# Patient Record
Sex: Female | Born: 2000 | Race: Black or African American | Hispanic: No | Marital: Single | Smoking: Never smoker
Health system: Southern US, Community
[De-identification: ages and names within clinical notes are randomized; demographics above are authoritative.]

## PROBLEM LIST (undated history)

## (undated) ENCOUNTER — Inpatient Hospital Stay (HOSPITAL_COMMUNITY): Payer: Self-pay

## (undated) DIAGNOSIS — Z789 Other specified health status: Secondary | ICD-10-CM

## (undated) DIAGNOSIS — A749 Chlamydial infection, unspecified: Secondary | ICD-10-CM

## (undated) HISTORY — PX: NO PAST SURGERIES: SHX2092

---
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
## 2018-05-21 IMAGING — US US MFM OB DETAIL+14 WK
1 series · 14 of 28 positions shown · non-contrast
Comparison: none

[Series 1: us mfm ob detail+14 wk · 79 acquisitions, 14 frames shown]
[im 3/79]
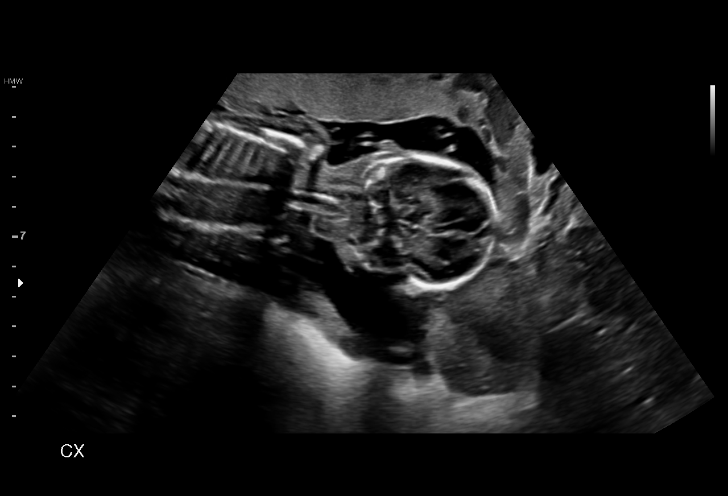
[im 9/79]
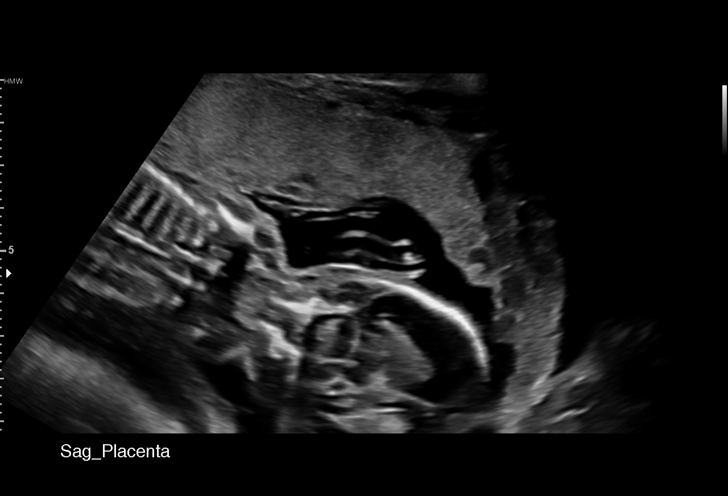
[im 15/79]
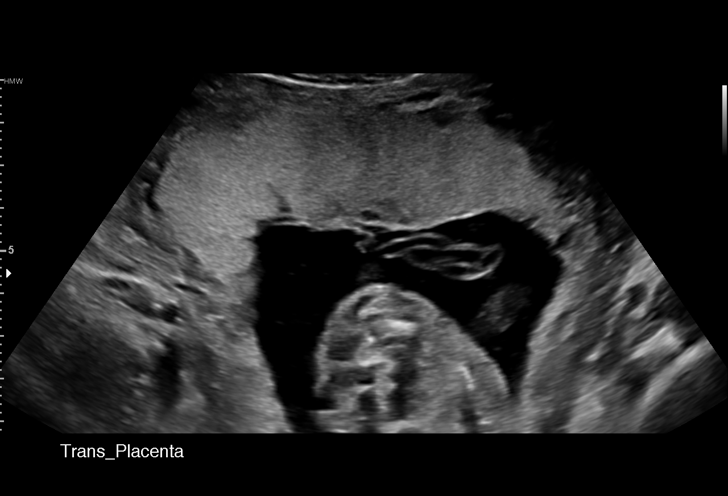
[im 21/79]
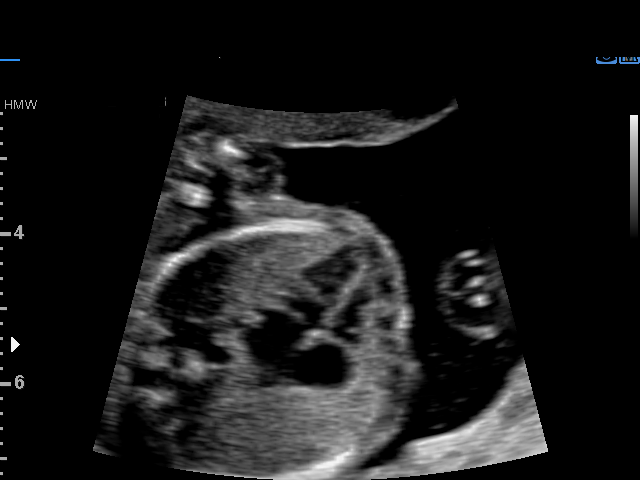
[im 27/79]
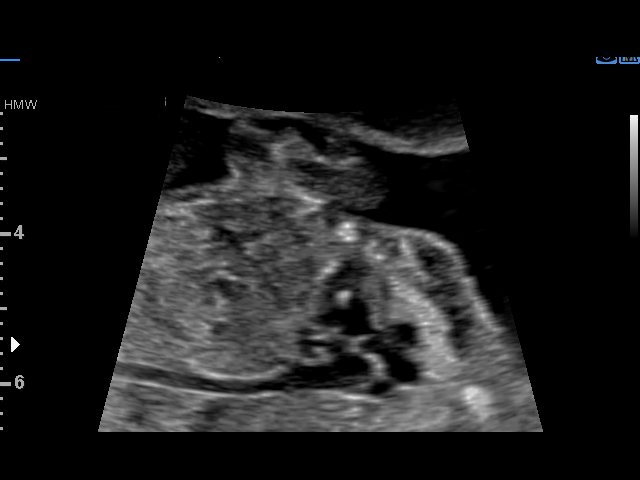
[im 32/79]
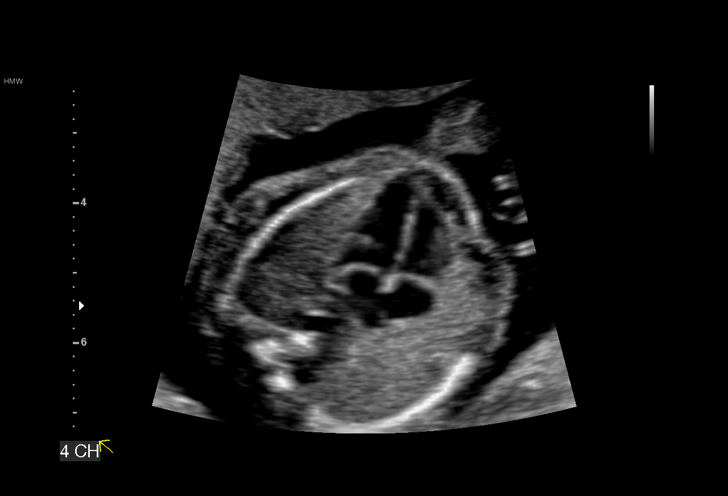
[im 38/79]
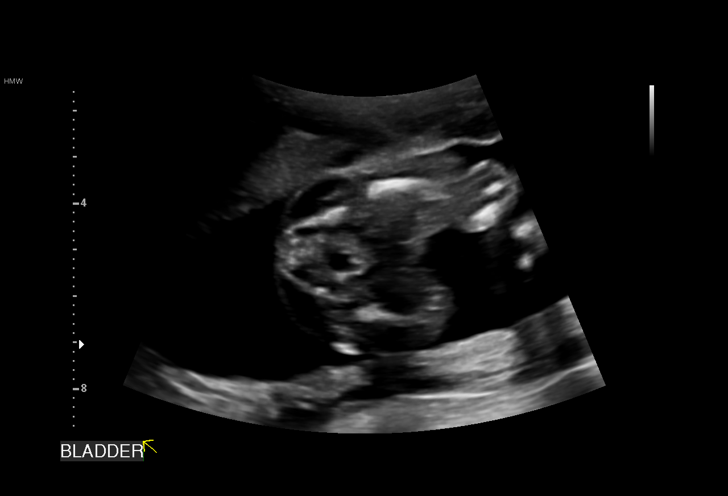
[im 44/79]
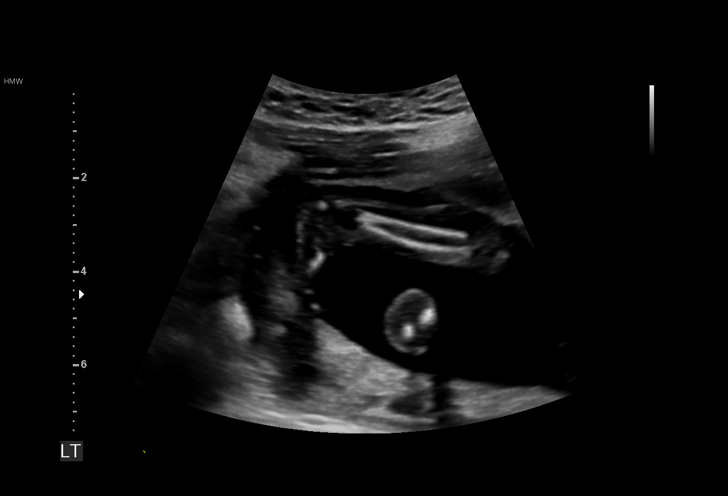
[im 50/79]
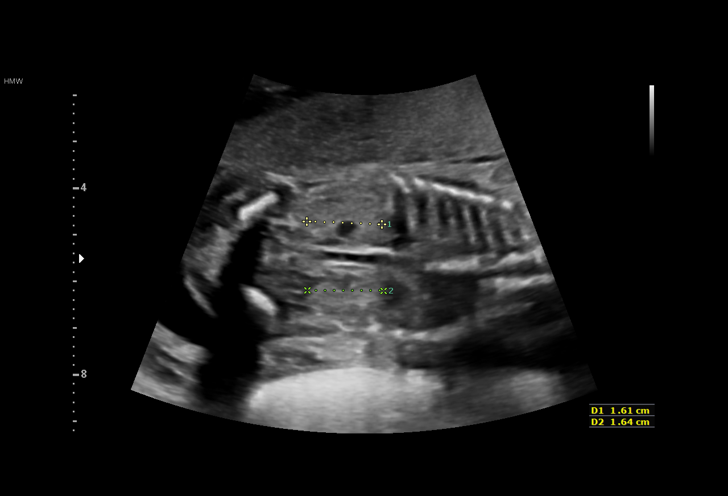
[im 55/79]
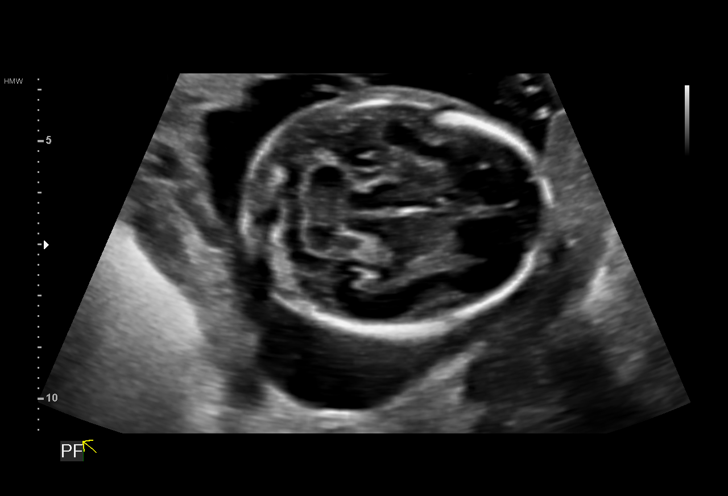
[im 61/79]
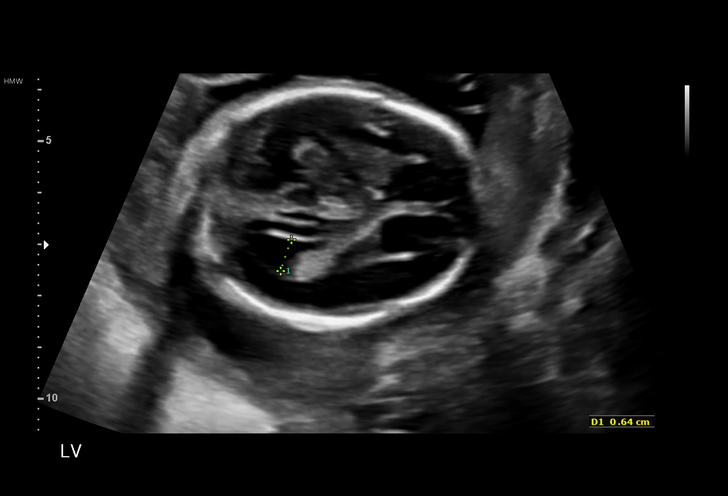
[im 67/79]
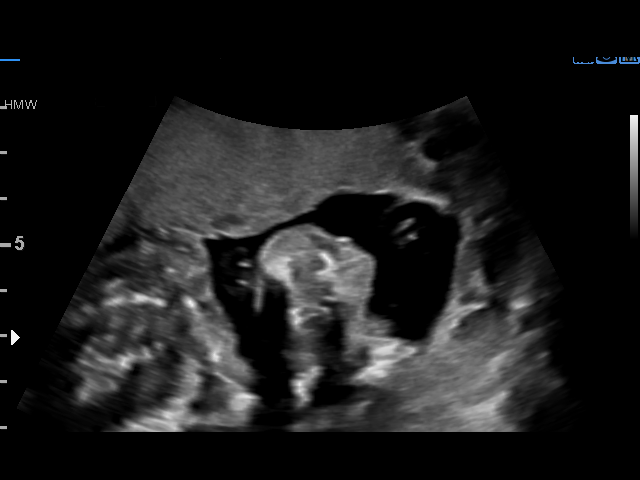
[im 73/79]
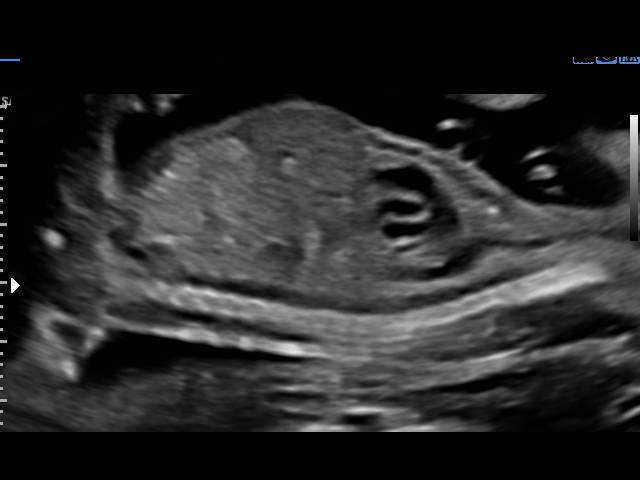
[im 79/79]
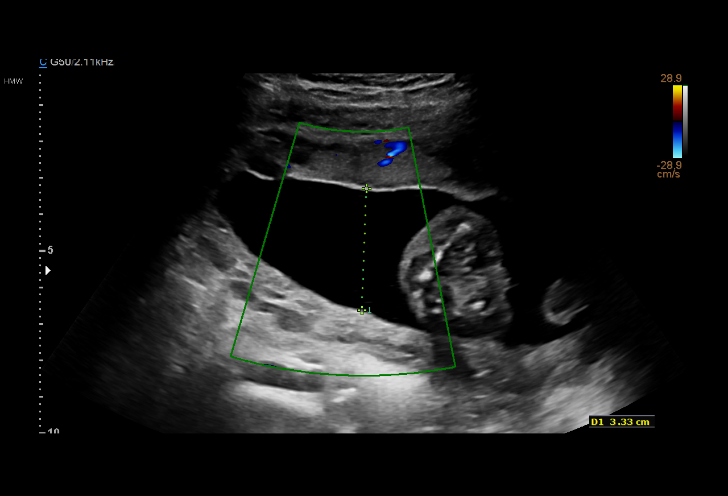

[14 of 28 positions shown; findings below may reference images not displayed]

1  HOUSSA SRA         [id number]      [id number]     [id number]
Indications

19 weeks gestation of pregnancy
Family history of congenital anomaly (FOB
situs inversus totalis)
Encounter for antenatal screening for
malformations
OB History

Gravidity:    1         Term:   0        Prem:   0        SAB:   0
TOP:          0       Ectopic:  0        Living: 1
Fetal Evaluation

Num Of Fetuses:     1
Fetal Heart         154
Rate(bpm):
Cardiac Activity:   Observed
Presentation:       Cephalic
Placenta:           Anterior, above cervical os
P. Cord Insertion:  Visualized, central

Amniotic Fluid
AFI FV:      Subjectively within normal limits
Biometry

BPD:      44.8  mm     G. Age:  19w 4d         68  %    CI:        72.31   %    70 - 86
FL/HC:      16.9   %    16.1 -
HC:      167.6  mm     G. Age:  19w 3d         56  %    HC/AC:      1.22        1.09 -
AC:      137.4  mm     G. Age:  19w 1d         47  %    FL/BPD:     63.2   %
FL:       28.3  mm     G. Age:  18w 5d         27  %    FL/AC:      20.6   %    20 - 24
HUM:      27.7  mm     G. Age:  18w 6d         43  %
Est. FW:     269  gm      0 lb 9 oz     42  %
Gestational Age

LMP:           19w 1d        Date:  02/24/17                 EDD:   12/01/17
U/S Today:     19w 2d                                        EDD:   11/30/17
Best:          19w 1d     Det. By:  LMP  (02/24/17)          EDD:   12/01/17
Anatomy

Cranium:               Appears normal         Aortic Arch:            Appears normal
Cavum:                 Appears normal         Ductal Arch:            Appears normal
Ventricles:            Appears normal         Diaphragm:              Appears normal
Choroid Plexus:        Appears normal         Stomach:                Appears normal, left
sided
Cerebellum:            Appears normal         Abdomen:                Appears normal
Posterior Fossa:       Appears normal         Abdominal Wall:         Appears nml (cord
insert, abd wall)
Nuchal Fold:           Appears normal         Cord Vessels:           Appears normal (3
vessel cord)
Face:                  Appears normal         Kidneys:                Appear normal
(orbits and profile)
Lips:                  Appears normal         Bladder:                Appears normal
Thoracic:              Appears normal         Spine:                  Not well visualized
Heart:                 Appears normal         Upper Extremities:      Appears normal
RVOT:                  Appears normal         Lower Extremities:      Appears normal
LVOT:                  Appears normal

Other:  Fetus appears to be a male. Heels visualized. Nasal bone visualized.
Technically difficult due to fetal position.
Cervix Uterus Adnexa

Cervix
Length:            3.1  cm.
Normal appearance by transabdominal scan.

Uterus
No abnormality visualized.

Left Ovary
No adnexal mass visualized.

Right Ovary
No adnexal mass visualized.

Cul De Sac:   No free fluid seen.

Adnexa:       No abnormality visualized.
Impression

SIUP at 67w6d for attempted comprehensive fetal survey in
gestation complicated by paternal history of situs inversus
active singleton fetus in cephalic presentation
situs solitus (normal)
no dysmorphic features are seen
limited spine views appear normal
EFW 42nd%'le
amniotic fluid volume is gestational age appropriate
cervix is long and closed, conferring low-risk for spontaneous
preterm delivery
no previa

Patient declined genetic counseling after situs solitus was
demonstrated.
Recommendations

Attempt to complete survey in 6 weeks was arranged prior to
departure from our ultrasound unit.

## 2019-03-17 ENCOUNTER — Other Ambulatory Visit: Payer: Self-pay

## 2019-03-17 ENCOUNTER — Encounter (HOSPITAL_COMMUNITY): Payer: Self-pay | Admitting: *Deleted

## 2019-03-17 ENCOUNTER — Emergency Department (HOSPITAL_COMMUNITY)
Admission: EM | Admit: 2019-03-17 | Discharge: 2019-03-18 | Disposition: A | Discharge status: Home / Self Care | Payer: Medicaid Other | Attending: Emergency Medicine | Admitting: Emergency Medicine

## 2019-03-17 DIAGNOSIS — N3000 Acute cystitis without hematuria: Secondary | ICD-10-CM | POA: Insufficient documentation

## 2019-03-17 DIAGNOSIS — N3090 Cystitis, unspecified without hematuria: Secondary | ICD-10-CM

## 2019-03-17 DIAGNOSIS — N61 Mastitis without abscess: Secondary | ICD-10-CM

## 2019-03-17 DIAGNOSIS — O9122 Nonpurulent mastitis associated with the puerperium: Secondary | ICD-10-CM | POA: Diagnosis present

## 2019-03-17 DIAGNOSIS — R590 Localized enlarged lymph nodes: Secondary | ICD-10-CM | POA: Diagnosis not present

## 2019-03-17 DIAGNOSIS — R509 Fever, unspecified: Secondary | ICD-10-CM | POA: Diagnosis not present

## 2019-03-17 DIAGNOSIS — R300 Dysuria: Secondary | ICD-10-CM | POA: Insufficient documentation

## 2019-03-17 LAB — URINALYSIS, ROUTINE W REFLEX MICROSCOPIC
Bilirubin Urine: NEGATIVE
Glucose, UA: NEGATIVE mg/dL
Hgb urine dipstick: NEGATIVE
Ketones, ur: 5 mg/dL — AB
Nitrite: NEGATIVE
Protein, ur: 30 mg/dL — AB
Specific Gravity, Urine: 1.034 — ABNORMAL HIGH (ref 1.005–1.030)
WBC, UA: >50 WBC/hpf — ABNORMAL HIGH (ref 0–5)
pH: 5 (ref 5.0–8.0)

## 2019-03-17 MED ORDER — CEPHALEXIN 500 MG PO CAPS: 500.0000 mg | ORAL_CAPSULE | Freq: Four times a day (QID) | ORAL | 0 refills | Status: DC

## 2019-03-17 MED ORDER — CEPHALEXIN 250 MG PO CAPS
500.0000 mg | ORAL_CAPSULE | Freq: Once | ORAL | Status: AC
  Administered 2019-03-17: 500 mg via ORAL
  Filled 2019-03-17: qty 2

## 2019-03-17 NOTE — ED Triage Notes (Signed)
Pt has had dysuria since this morning. Also more concerned with breast pain. Pt is breastfeeding; has significant pain and redness to L breast while at work. Attempted to pump and pumped more blood than milk. Reports chills that also started this afternoon. Took ibuprofen at 4pm

## 2019-03-17 NOTE — Discharge Instructions (Signed)
Please read and follow all provided instructions.  Your diagnoses today include:  1. Mastitis   2. Cystitis     Tests performed today include:  Vital signs. See below for your results today.   Medications prescribed:   Keflex (cephalexin) - antibiotic  You have been prescribed an antibiotic medicine: take the entire course of medicine even if you are feeling better. Stopping early can cause the antibiotic not to work.  Take any prescribed medications only as directed.   Home care instructions:  Follow any educational materials contained in this packet. Do not apply alcohol or hydrogen peroxide. Cover the area if it draining or weeping.   Follow-up instructions: Please follow-up with your primary care provider in the next 3-5 days if symptoms are not improving.  Return instructions:  Return to the Emergency Department if you have:  Persistent or worsening fever  Worsening symptoms  Worsening pain  Worsening swelling  Redness of the skin that moves away from the affected area, especially if it streaks away from the affected area   Any other emergent concerns  Your vital signs today were: BP 115/82 (BP Location: Right Arm)    Pulse (!) 128    Temp 98.4 F (36.9 C) (Oral)    Resp 18    SpO2 99%  If your blood pressure (BP) was elevated above 135/85 this visit, please have this repeated by your doctor within one month. --------------

## 2019-03-17 NOTE — ED Provider Notes (Signed)
[facility] EMERGENCY DEPARTMENT Provider Note   CSN: [id number] Arrival date & time: 03/17/19  2215     History   Chief Complaint Chief Complaint  Patient presents with  . Dysuria    HPI Lisa Best is a 18 y.o. female.     Patient presents with complaint of breast pain and fever as well as dysuria and increased frequency and urgency.  Dysuria started this morning.  She has never had a urinary tract infection before.  No blood noted in the urine.  No vaginal discharge.  In addition, patient also noted that this morning she had significant pain in her left breast while pumping.  No problems with the right breast.  She has a son who is 60 month old.  She pumped again while at work and noted a significant amount of blood in her breast milk along with increasing pain and redness of the left breast. She also developed chills and her temperature was 102 F.  Patient did take ibuprofen at 4 PM today.  No fever neurologic emergency department.     Past Medical History:  Diagnosis Date  . Chlamydia 06/2016   treated  . Medical history non-contributory     Patient Active Problem List   Diagnosis Date Noted  . Encounter for elective induction of labor 11/26/2017  . Nausea vomiting and diarrhea 11/16/2017    Past Surgical History:  Procedure Laterality Date  . NO PAST SURGERIES       OB History    Gravida  1   Para  0   Term  0   Preterm  0   AB  0   Living  0     SAB  0   TAB  0   Ectopic  0   Multiple  0   Live Births               Home Medications    Prior to Admission medications   Medication Sig Start Date End Date Taking? Authorizing Provider  EPINEPHrine (EPIPEN 2-PAK IJ) Inject 1 Applicatorful as directed once as needed (for severe allergic reaction).    [provider]  Prenatal Vit-Fe Fumarate-FA (PRENATAL VITAMIN PO) Take 1 tablet by mouth daily.     [provider]    Family History No family  history on file.  Social History Social History   Tobacco Use  . Smoking status: Never Smoker  . Smokeless tobacco: Never Used  Substance Use Topics  . Alcohol use: No  . Drug use: No    Types: Marijuana    Comment: occasional     Allergies   Bee venom   Review of Systems Review of Systems  Constitutional: Positive for chills and fever.  HENT: Negative for rhinorrhea and sore throat.   Eyes: Negative for redness.  Respiratory: Negative for cough.   Cardiovascular: Positive for chest pain (breast pain).  Gastrointestinal: Negative for abdominal pain, diarrhea, nausea and vomiting.  Genitourinary: Positive for dysuria, frequency and urgency. Negative for hematuria, vaginal bleeding and vaginal discharge.  Musculoskeletal: Negative for myalgias.  Skin: Negative for rash.  Neurological: Negative for headaches.     Physical Exam Updated Vital Signs BP 115/82 (BP Location: Right Arm)   Pulse (!) 128   Temp 98.4 F (36.9 C) (Oral)   Resp 18   SpO2 99%   Physical Exam Vitals signs and nursing note reviewed.  Constitutional:      Appearance: She is well-developed.  HENT:     Head: Normocephalic and atraumatic.  Eyes:     General:        Right eye: No discharge.        Left eye: No discharge.     Conjunctiva/sclera: Conjunctivae normal.  Neck:     Musculoskeletal: Normal range of motion and neck supple.  Cardiovascular:     Rate and Rhythm: Normal rate and regular rhythm.     Heart sounds: Normal heart sounds.  Pulmonary:     Effort: Pulmonary effort is normal.     Breath sounds: Normal breath sounds.  Chest:     Breasts:        Right: No swelling or bleeding.        Left: Skin change and tenderness present. No swelling, bleeding, mass or nipple discharge.    Abdominal:     Palpations: Abdomen is soft.     Tenderness: There is no abdominal tenderness.  Lymphadenopathy:     Upper Body:     Left upper body: Pectoral adenopathy present.  Skin:     General: Skin is warm and dry.  Neurological:     Mental Status: She is alert.      ED Treatments / Results  Labs (all labs ordered are listed, but only abnormal results are displayed) Labs Reviewed  URINALYSIS, ROUTINE W REFLEX MICROSCOPIC - Abnormal; Notable for the following components:      Result Value   APPearance HAZY (*)    Specific Gravity, Urine 1.034 (*)    Ketones, ur 5 (*)    Protein, ur 30 (*)    Leukocytes,Ua LARGE (*)    WBC, UA >50 (*)    Bacteria, UA FEW (*)    All other components within normal limits    EKG    Radiology No results found.  Procedures Procedures (including critical care time)  Medications Ordered in ED Medications  cephALEXin (KEFLEX) capsule 500 mg (500 mg Oral Given 03/17/19 2258)     Initial Impression / Assessment and Plan / ED Course  I have reviewed the triage vital signs and the nursing notes.  Pertinent labs & imaging results that were available during my care of the patient were reviewed by me and considered in my medical decision making (see chart for details).        Patient seen and examined.  UA pending.  Suspect recent fever due to mastitis.  Patient will start on Keflex.  She appears well, nontoxic.  Vital signs reviewed and are as follows: BP 115/82 (BP Location: Right Arm)   Pulse (!) 128   Temp 98.4 F (36.9 C) (Oral)   Resp 18   SpO2 99%   11:44 PM patient's UA and clinical presentation are most consistent with acute cystitis.  Doubt pyelo given lack of flank pain.  Patient is nontoxic in appearance.  Recheck temperature in room and it was 98.4 F.  Patient counseled on use of Keflex, warm compresses.  Encourage PCP follow-up in 3 to 5 days if not improved.  Encouraged return to the emergency department with high persistent fever, worsening redness or pain, if she develops a boil-like area on the breast.  Discussed conservative measures.  Final Clinical Impressions(s) / ED Diagnoses   Final diagnoses:   Mastitis  Cystitis   Mastitis: Secondary to breast-feeding.  Patient did report a fever today.  Antibiotics indicated.  Patient is nontoxic.  Doubt sepsis.  Pulse rate elevated on arrival with no other sirs criteria  met.  Cystitis: Symptoms consistent with lower UTI.  No flank pain to suspect pyelonephritis.  I suspect this fever is more related to mastitis versus UTI.  Patient appears well, nontoxic.  ED Discharge Orders         Ordered    cephALEXin (KEFLEX) 500 MG capsule  4 times daily     03/17/19 2344           Carlisle Cater, Hershal Coria 03/17/19 2347    Carmin Muskrat, MD 03/18/19 1806

## 2019-09-20 ENCOUNTER — Other Ambulatory Visit: Payer: Self-pay

## 2019-09-20 ENCOUNTER — Encounter (HOSPITAL_COMMUNITY): Payer: Self-pay

## 2019-09-20 ENCOUNTER — Emergency Department (HOSPITAL_COMMUNITY)
Admission: EM | Admit: 2019-09-20 | Discharge: 2019-09-20 | Disposition: A | Discharge status: Home / Self Care | Payer: Medicaid Other | Attending: Emergency Medicine | Admitting: Emergency Medicine

## 2019-09-20 DIAGNOSIS — J029 Acute pharyngitis, unspecified: Secondary | ICD-10-CM | POA: Insufficient documentation

## 2019-09-20 LAB — GROUP A STREP BY PCR: Group A Strep by PCR: NOT DETECTED

## 2019-09-20 MED ORDER — ACETAMINOPHEN 325 MG PO TABS
650.0000 mg | ORAL_TABLET | Freq: Once | ORAL | Status: AC
  Administered 2019-09-20: 18:00:00 650 mg via ORAL
  Filled 2019-09-20: qty 2

## 2019-09-20 NOTE — ED Provider Notes (Signed)
[facility] EMERGENCY DEPARTMENT Provider Note   CSN: [id number] Arrival date & time: 09/20/19  1718     History Chief Complaint  Patient presents with  . Sore Throat    Lisa Best is a 18 y.o. female.  18 year old who presents for sore throat.  Patient states she had a sore throat approximately 2 to 3 days ago.  The pain is gotten worse.  No known fevers, however she has a fever at triage.  No difficulty swallowing.  The pain is midline.  No prior history of neck abscess.  There is a strong family history of tonsillitis.  No rash.  No headache.  No abdominal pain.  No vomiting.  No ear pain.  The history is provided by the patient. No language interpreter was used.  Sore Throat This is a new problem. The problem occurs constantly. The problem has not changed since onset.Pertinent negatives include no chest pain, no abdominal pain, no headaches and no shortness of breath. The symptoms are aggravated by swallowing. Nothing relieves the symptoms. She has tried nothing for the symptoms.       Past Medical History:  Diagnosis Date  . Chlamydia 06/2016   treated  . Medical history non-contributory     Patient Active Problem List   Diagnosis Date Noted  . Encounter for elective induction of labor 11/26/2017  . Nausea vomiting and diarrhea 11/16/2017    Past Surgical History:  Procedure Laterality Date  . NO PAST SURGERIES       OB History    Gravida  1   Para  0   Term  0   Preterm  0   AB  0   Living  0     SAB  0   TAB  0   Ectopic  0   Multiple  0   Live Births              History reviewed. No pertinent family history.  Social History   Tobacco Use  . Smoking status: Never Smoker  . Smokeless tobacco: Never Used  Substance Use Topics  . Alcohol use: No  . Drug use: No    Types: Marijuana    Comment: occasional    Home Medications Prior to Admission medications   Medication Sig Start Date End Date Taking?  Authorizing Provider  cephALEXin (KEFLEX) 500 MG capsule Take 1 capsule (500 mg total) by mouth 4 (four) times daily. 03/17/19   Renne Crigler, PA-C  EPINEPHrine (EPIPEN 2-PAK IJ) Inject 1 Applicatorful as directed once as needed (for severe allergic reaction).    [provider]  Prenatal Vit-Fe Fumarate-FA (PRENATAL VITAMIN PO) Take 1 tablet by mouth daily.     [provider]    Allergies    Bee venom  Review of Systems   Review of Systems  Respiratory: Negative for shortness of breath.   Cardiovascular: Negative for chest pain.  Gastrointestinal: Negative for abdominal pain.  Neurological: Negative for headaches.  All other systems reviewed and are negative.   Physical Exam Updated Vital Signs BP 122/73 (BP Location: Left Arm)   Pulse (!) 126   Temp (!) 101.4 F (38.6 C) (Oral)   Resp 12   Ht 5\' 7"  (1.702 m)   Wt 61.8 kg   SpO2 98%   BMI 21.34 kg/m   Physical Exam Vitals and nursing note reviewed.  Constitutional:      Appearance: She is well-developed.  HENT:  Head: Normocephalic and atraumatic.     Right Ear: Tympanic membrane and external ear normal.     Left Ear: Tympanic membrane and external ear normal.     Mouth/Throat:     Pharynx: Pharyngeal swelling, oropharyngeal exudate and posterior oropharyngeal erythema present.     Comments: Minimal swelling noted, mild redness noted.  Few exudates noted on right tonsil. Eyes:     Conjunctiva/sclera: Conjunctivae normal.  Cardiovascular:     Rate and Rhythm: Normal rate.     Heart sounds: Normal heart sounds.  Pulmonary:     Effort: Pulmonary effort is normal.     Breath sounds: Normal breath sounds.  Abdominal:     General: Bowel sounds are normal.     Palpations: Abdomen is soft.     Tenderness: There is no abdominal tenderness. There is no rebound.  Musculoskeletal:        General: Normal range of motion.     Cervical back: Normal range of motion and neck supple.  Skin:    General:  Skin is warm.  Neurological:     Mental Status: She is alert and oriented to person, place, and time.     ED Results / Procedures / Treatments   Labs (all labs ordered are listed, but only abnormal results are displayed) Labs Reviewed  GROUP A STREP BY PCR    EKG None  Radiology No results found.  Procedures Procedures (including critical care time)  Medications Ordered in ED Medications  acetaminophen (TYLENOL) tablet 650 mg (650 mg Oral Given 09/20/19 1755)    ED Course  I have reviewed the triage vital signs and the nursing notes.  Pertinent labs & imaging results that were available during my care of the patient were reviewed by me and considered in my medical decision making (see chart for details).    MDM Rules/Calculators/A&P                      57 y with sore throat.  The pain is midline and no signs of pta.  Pt is non toxic and no lymphadenopathy to suggest RPA,  Possible strep so will obtain rapid test.  Too early to test for mono as symptoms for about 2-3 days, no signs of dehydration to suggest need for IVF.   No barky cough to suggest croup.     Strep is negative. Patient with likely viral pharyngitis. Discussed symptomatic care. Discussed signs that warrant reevaluation. Patient to follow up with PCP in 2-3 days if not improved.   Final Clinical Impression(s) / ED Diagnoses Final diagnoses:  Acute pharyngitis, unspecified etiology    Rx / DC Orders ED Discharge Orders    None       Louanne Skye, MD 09/20/19 2010

## 2019-09-20 NOTE — ED Triage Notes (Signed)
Pt arrives POV for eval of tonsillar pain and sore throat. Pt reports onset Sunday of throat pain, w/ swollen lymphnodes. Pt reports siblings frequently have tonsillitis and her presentation is similar. Oropharynx is patent, and clear. NARD in triage

## 2019-09-20 NOTE — ED Notes (Signed)
Pt. Left without being formally discharged. Pt. Was seen by ED provider before leaving and was made aware that her strep test came back negative.

## 2020-02-15 ENCOUNTER — Emergency Department (HOSPITAL_COMMUNITY)
Admission: EM | Admit: 2020-02-15 | Discharge: 2020-02-15 | Disposition: A | Discharge status: Home / Self Care | Payer: Medicaid Other | Attending: Emergency Medicine | Admitting: Emergency Medicine

## 2020-02-15 ENCOUNTER — Encounter (HOSPITAL_COMMUNITY): Payer: Self-pay

## 2020-02-15 ENCOUNTER — Other Ambulatory Visit: Payer: Self-pay

## 2020-02-15 DIAGNOSIS — H1031 Unspecified acute conjunctivitis, right eye: Secondary | ICD-10-CM

## 2020-02-15 DIAGNOSIS — Z79899 Other long term (current) drug therapy: Secondary | ICD-10-CM | POA: Diagnosis not present

## 2020-02-15 DIAGNOSIS — H5789 Other specified disorders of eye and adnexa: Secondary | ICD-10-CM | POA: Diagnosis present

## 2020-02-15 MED ORDER — ERYTHROMYCIN 5 MG/GM OP OINT: TOPICAL_OINTMENT | OPHTHALMIC | 0 refills | Status: DC

## 2020-02-15 NOTE — ED Provider Notes (Signed)
[facility] DEPT Provider Note   CSN: [id number] Arrival date & time: 02/15/20  0017     History Chief Complaint  Patient presents with  . Eye Drainage    Lisa Best is a 19 y.o. female.  19 y.o female with a PMH of Chlamydia presents to the ED with a chief complaint of right eye redness x today. Patient reports she's had URI symptoms for the past twp days, reports a cough with some green sputum. States, she noted crusting of her right eye today, more so of a green discharge. She has not tried any medication for improvement in symptoms. No trauma, no limited EOMS, no fevers.   The history is provided by the patient.       Past Medical History:  Diagnosis Date  . Chlamydia 06/2016   treated  . Medical history non-contributory     Patient Active Problem List   Diagnosis Date Noted  . Encounter for elective induction of labor 11/26/2017  . Nausea vomiting and diarrhea 11/16/2017    Past Surgical History:  Procedure Laterality Date  . NO PAST SURGERIES       OB History    Gravida  1   Para  0   Term  0   Preterm  0   AB  0   Living  0     SAB  0   TAB  0   Ectopic  0   Multiple  0   Live Births              No family history on file.  Social History   Tobacco Use  . Smoking status: Never Smoker  . Smokeless tobacco: Never Used  Substance Use Topics  . Alcohol use: No  . Drug use: No    Types: Marijuana    Comment: occasional    Home Medications Prior to Admission medications   Medication Sig Start Date End Date Taking? Authorizing Provider  cephALEXin (KEFLEX) 500 MG capsule Take 1 capsule (500 mg total) by mouth 4 (four) times daily. 03/17/19   Carlisle Cater, PA-C  EPINEPHrine (EPIPEN 2-PAK IJ) Inject 1 Applicatorful as directed once as needed (for severe allergic reaction).    [provider]  erythromycin ophthalmic ointment Place a 1/2 inch ribbon of ointment into the lower eyelid.  02/15/20   Janeece Fitting, PA-C  Prenatal Vit-Fe Fumarate-FA (PRENATAL VITAMIN PO) Take 1 tablet by mouth daily.     [provider]    Allergies    Bee venom  Review of Systems   Review of Systems  Constitutional: Negative for fever.  Eyes: Positive for discharge and redness. Negative for photophobia, itching and visual disturbance.    Physical Exam Updated Vital Signs BP 117/78   Pulse 89   Temp 98.1 F (36.7 C) (Oral)   Resp 18   Ht 5\' 6"  (1.676 m)   Wt 49.9 kg   SpO2 100%   BMI 17.75 kg/m   Physical Exam Vitals and nursing note reviewed.  Constitutional:      Appearance: Normal appearance.  HENT:     Head: Normocephalic and atraumatic.     Nose: Congestion present.     Mouth/Throat:     Mouth: Mucous membranes are moist.  Eyes:     General:        Right eye: Discharge present.     Extraocular Movements: Extraocular movements intact.     Right eye: Normal extraocular motion.  Left eye: Normal extraocular motion.     Conjunctiva/sclera:     Right eye: Right conjunctiva is injected. No chemosis, exudate or hemorrhage.    Left eye: Left conjunctiva is not injected. No chemosis, exudate or hemorrhage.    Comments: Pupils are equal and reactive. Yellow discharge noted on the bottom eyelid.   Cardiovascular:     Rate and Rhythm: Normal rate.  Pulmonary:     Effort: Pulmonary effort is normal.  Abdominal:     General: Abdomen is flat.  Musculoskeletal:     Cervical back: Normal range of motion and neck supple.  Skin:    General: Skin is warm and dry.  Neurological:     Mental Status: She is alert and oriented to person, place, and time.    .ED Results / Procedures / Treatments   Labs (all labs ordered are listed, but only abnormal results are displayed) Labs Reviewed - No data to display  EKG None  Radiology No results found.  Procedures Procedures (including critical care time)  Medications Ordered in ED Medications - No data to  display  ED Course  I have reviewed the triage vital signs and the nursing notes.  Pertinent labs & imaging results that were available during my care of the patient were reviewed by me and considered in my medical decision making (see chart for details).    MDM Rules/Calculators/A&P   Patient with no pertinent past medical history presents to the ED with complaints of right eye drainage and erythema since today.  Reports she has had URI symptoms the last couple days, had a green cough, symptoms were improving however noted her eye to have crusty discharge today.  Has not tried medication for improvement in her symptoms.  During evaluation his EOMs are intact, no visible stye or chalazion on my exam.  Nipples are equal and reactive, no ecchymosis or hemorrhage noted.  Small amount of yellow drainage noted to the right eye, some suspicion for bacterial conjunctivitis.  Will treat symptomatically with erythromycin ointment.  We discussed using this medication.  She is to return if she experiences any pain with EOMs, fever, worsening symptoms.  Patient nurses agrees to management, return precautions discussed at length.   Portions of this note were generated with Scientist, clinical (histocompatibility and immunogenetics). Dictation errors may occur despite Best attempts at proofreading.  Final Clinical Impression(s) / ED Diagnoses Final diagnoses:  Acute bacterial conjunctivitis of right eye    Rx / DC Orders ED Discharge Orders         Ordered    erythromycin ophthalmic ointment     02/15/20 0106           Claude Manges, PA-C 02/15/20 0109    Molpus, Jonny Ruiz, MD 02/15/20 [phone]

## 2020-02-15 NOTE — Discharge Instructions (Addendum)
I have provided a prescription to help with your right eye infection, please use this as directed daily for the next 5 days.  If you experience any fever, worsening symptoms, or difficulty with movement of your eye please return to the ED.

## 2020-02-15 NOTE — ED Triage Notes (Signed)
Pt c/o right eye redness that is draining greenish color since this morning.

## 2021-04-03 ENCOUNTER — Emergency Department (HOSPITAL_COMMUNITY)
Admission: EM | Admit: 2021-04-03 | Discharge: 2021-04-03 | Disposition: A | Discharge status: Home / Self Care | Payer: Medicaid Other | Attending: Emergency Medicine | Admitting: Emergency Medicine

## 2021-04-03 ENCOUNTER — Other Ambulatory Visit: Payer: Self-pay

## 2021-04-03 ENCOUNTER — Encounter (HOSPITAL_COMMUNITY): Payer: Self-pay

## 2021-04-03 DIAGNOSIS — R1110 Vomiting, unspecified: Secondary | ICD-10-CM | POA: Diagnosis present

## 2021-04-03 DIAGNOSIS — G43809 Other migraine, not intractable, without status migrainosus: Secondary | ICD-10-CM | POA: Insufficient documentation

## 2021-04-03 LAB — CBC WITH DIFFERENTIAL/PLATELET
Abs Immature Granulocytes: 0.01 10*3/uL (ref 0.00–0.07)
Basophils Absolute: 0 10*3/uL (ref 0.0–0.1)
Basophils Relative: 0 %
Eosinophils Absolute: 0 10*3/uL (ref 0.0–0.5)
Eosinophils Relative: 0 %
HCT: 39.6 % (ref 36.0–46.0)
Hemoglobin: 13.5 g/dL (ref 12.0–15.0)
Immature Granulocytes: 0 %
Lymphocytes Relative: 23 %
Lymphs Abs: 1 10*3/uL (ref 0.7–4.0)
MCH: 25.8 pg — ABNORMAL LOW (ref 26.0–34.0)
MCHC: 34.1 g/dL (ref 30.0–36.0)
MCV: 75.6 fL — ABNORMAL LOW (ref 80.0–100.0)
Monocytes Absolute: 0.7 10*3/uL (ref 0.1–1.0)
Monocytes Relative: 16 %
Neutro Abs: 2.8 10*3/uL (ref 1.7–7.7)
Neutrophils Relative %: 61 %
Platelets: 214 10*3/uL (ref 150–400)
RBC: 5.24 MIL/uL — ABNORMAL HIGH (ref 3.87–5.11)
RDW: 13.6 % (ref 11.5–15.5)
WBC: 4.6 10*3/uL (ref 4.0–10.5)
nRBC: 0 % (ref 0.0–0.2)

## 2021-04-03 LAB — COMPREHENSIVE METABOLIC PANEL
ALT: 12 U/L (ref 0–44)
AST: 17 U/L (ref 15–41)
Albumin: 3.8 g/dL (ref 3.5–5.0)
Alkaline Phosphatase: 62 U/L (ref 38–126)
Anion gap: 8 (ref 5–15)
BUN: 8 mg/dL (ref 6–20)
CO2: 24 mmol/L (ref 22–32)
Calcium: 8.8 mg/dL — ABNORMAL LOW (ref 8.9–10.3)
Chloride: 105 mmol/L (ref 98–111)
Creatinine, Ser: 0.84 mg/dL (ref 0.44–1.00)
GFR, Estimated: >60 mL/min (ref >60)
Glucose, Bld: 88 mg/dL (ref 70–99)
Potassium: 3.9 mmol/L (ref 3.5–5.1)
Sodium: 137 mmol/L (ref 135–145)
Total Bilirubin: 1.3 mg/dL — ABNORMAL HIGH (ref 0.3–1.2)
Total Protein: 7.5 g/dL (ref 6.5–8.1)

## 2021-04-03 LAB — URINALYSIS, ROUTINE W REFLEX MICROSCOPIC
Bilirubin Urine: NEGATIVE
Glucose, UA: NEGATIVE mg/dL
Ketones, ur: 20 mg/dL — AB
Leukocytes,Ua: NEGATIVE
Nitrite: NEGATIVE
Protein, ur: NEGATIVE mg/dL
Specific Gravity, Urine: 1.026 (ref 1.005–1.030)
pH: 6 (ref 5.0–8.0)

## 2021-04-03 LAB — LIPASE, BLOOD: Lipase: 32 U/L (ref 11–51)

## 2021-04-03 LAB — I-STAT BETA HCG BLOOD, ED (MC, WL, AP ONLY): I-stat hCG, quantitative: <5 m[IU]/mL (ref <5)

## 2021-04-03 MED ORDER — DIPHENHYDRAMINE HCL 50 MG/ML IJ SOLN
12.5000 mg | Freq: Once | INTRAMUSCULAR | Status: AC
  Administered 2021-04-03: 12.5 mg via INTRAVENOUS
  Filled 2021-04-03: qty 1

## 2021-04-03 MED ORDER — METOCLOPRAMIDE HCL 5 MG/ML IJ SOLN
10.0000 mg | Freq: Once | INTRAMUSCULAR | Status: AC
  Administered 2021-04-03: 10 mg via INTRAVENOUS
  Filled 2021-04-03: qty 2

## 2021-04-03 MED ORDER — SODIUM CHLORIDE 0.9 % IV BOLUS
1000.0000 mL | Freq: Once | INTRAVENOUS | Status: AC
  Administered 2021-04-03: 1000 mL via INTRAVENOUS

## 2021-04-03 MED ORDER — ONDANSETRON 4 MG PO TBDP
4.0000 mg | ORAL_TABLET | Freq: Once | ORAL | Status: AC
  Administered 2021-04-03: 4 mg via ORAL
  Filled 2021-04-03: qty 1

## 2021-04-03 MED ORDER — ONDANSETRON 4 MG PO TBDP: 4.0000 mg | ORAL_TABLET | Freq: Three times a day (TID) | ORAL | 0 refills | Status: DC | PRN

## 2021-04-03 NOTE — ED Provider Notes (Signed)
[facility] EMERGENCY DEPARTMENT Provider Note   CSN: [id number] Arrival date & time: 04/03/21  1313     History Chief Complaint  Patient presents with   Migraine    Lisa Best is a 20 y.o. female who presents to ED with a chief complaint of migraine.  Started having a migraine 3 days ago.  States that she does not typically get migraines "that often."  She took an Advil last night without much improvement in her symptoms.  2 days ago started having nonbloody, nonbilious emesis that has persisted.  She states that she is unable to keep any food or drink down.  She denies any vision changes, numbness in arms or legs, head injuries, anticoagulant use, fever, neck stiffness, abdominal pain, chest pain, personal or family history of aneurysms.  HPI     Past Medical History:  Diagnosis Date   Chlamydia 06/2016   treated   Medical history non-contributory     Patient Active Problem List   Diagnosis Date Noted   Encounter for elective induction of labor 11/26/2017   Nausea vomiting and diarrhea 11/16/2017    Past Surgical History:  Procedure Laterality Date   NO PAST SURGERIES       OB History     Gravida  1   Para  0   Term  0   Preterm  0   AB  0   Living  0      SAB  0   IAB  0   Ectopic  0   Multiple  0   Live Births              No family history on file.  Social History   Tobacco Use   Smoking status: Never   Smokeless tobacco: Never  Substance Use Topics   Alcohol use: No   Drug use: No    Types: Marijuana    Comment: occasional    Home Medications Prior to Admission medications   Medication Sig Start Date End Date Taking? Authorizing Provider  EPINEPHrine (EPIPEN 2-PAK IJ) Inject 1 Applicatorful as directed once as needed (for severe allergic reaction).   Yes [provider]  ondansetron (ZOFRAN ODT) 4 MG disintegrating tablet Take 1 tablet (4 mg total) by mouth every 8 (eight) hours as needed for  nausea or vomiting. 04/03/21  Yes Burlie Cajamarca, PA-C  cephALEXin (KEFLEX) 500 MG capsule Take 1 capsule (500 mg total) by mouth 4 (four) times daily. Patient not taking: Reported on 04/03/2021 03/17/19   Renne Crigler, PA-C  erythromycin ophthalmic ointment Place a 1/2 inch ribbon of ointment into the lower eyelid. Patient not taking: Reported on 04/03/2021 02/15/20   Claude Manges, PA-C    Allergies    Bee venom  Review of Systems   Review of Systems  Constitutional:  Negative for appetite change, chills and fever.  HENT:  Negative for ear pain, rhinorrhea, sneezing and sore throat.   Eyes:  Negative for photophobia and visual disturbance.  Respiratory:  Negative for cough, chest tightness, shortness of breath and wheezing.   Cardiovascular:  Negative for chest pain and palpitations.  Gastrointestinal:  Positive for vomiting. Negative for abdominal pain, blood in stool, constipation, diarrhea and nausea.  Genitourinary:  Negative for dysuria, hematuria and urgency.  Musculoskeletal:  Negative for myalgias.  Skin:  Negative for rash.  Neurological:  Positive for headaches. Negative for dizziness, weakness and light-headedness.   Physical Exam Updated Vital Signs BP 98/66  Pulse 73   Temp 98.4 F (36.9 C)   Resp 18   Ht 5\' 5"  (1.651 m)   Wt 49.9 kg   LMP 04/03/2021   SpO2 100%   BMI 18.30 kg/m   Physical Exam Vitals and nursing note reviewed.  Constitutional:      General: She is not in acute distress.    Appearance: She is well-developed.  HENT:     Head: Normocephalic and atraumatic.     Nose: Nose normal.  Eyes:     General: No scleral icterus.       Left eye: No discharge.     Conjunctiva/sclera: Conjunctivae normal.  Neck:     Comments: No meningismus Cardiovascular:     Rate and Rhythm: Normal rate and regular rhythm.     Heart sounds: Normal heart sounds. No murmur heard.   No friction rub. No gallop.  Pulmonary:     Effort: Pulmonary effort is normal. No  respiratory distress.     Breath sounds: Normal breath sounds.  Abdominal:     General: Bowel sounds are normal. There is no distension.     Palpations: Abdomen is soft.     Tenderness: There is no abdominal tenderness. There is no guarding.  Musculoskeletal:        General: Normal range of motion.     Cervical back: Normal range of motion and neck supple.  Skin:    General: Skin is warm and dry.     Findings: No rash.  Neurological:     Mental Status: She is alert and oriented to person, place, and time.     Cranial Nerves: No cranial nerve deficit.     Sensory: No sensory deficit.     Motor: No weakness or abnormal muscle tone.     Coordination: Coordination normal.     Comments: Pupils reactive. No facial asymmetry noted. Cranial nerves appear grossly intact. Sensation intact to light touch on face, BUE and BLE. Strength 5/5 in BUE and BLE. Normal finger-to-nose coordination bilaterally.    ED Results / Procedures / Treatments   Labs (all labs ordered are listed, but only abnormal results are displayed) Labs Reviewed  CBC WITH DIFFERENTIAL/PLATELET - Abnormal; Notable for the following components:      Result Value   RBC 5.24 (*)    MCV 75.6 (*)    MCH 25.8 (*)    All other components within normal limits  COMPREHENSIVE METABOLIC PANEL - Abnormal; Notable for the following components:   Calcium 8.8 (*)    Total Bilirubin 1.3 (*)    All other components within normal limits  URINALYSIS, ROUTINE W REFLEX MICROSCOPIC - Abnormal; Notable for the following components:   APPearance HAZY (*)    Hgb urine dipstick MODERATE (*)    Ketones, ur 20 (*)    Bacteria, UA RARE (*)    All other components within normal limits  LIPASE, BLOOD  I-STAT BETA HCG BLOOD, ED ([facility], [facility], [facility] ONLY)    EKG None  Radiology No results found.  Procedures Procedures   Medications Ordered in ED Medications  ondansetron (ZOFRAN-ODT) disintegrating tablet 4 mg (4 mg Oral Given 04/03/21 1445)   metoCLOPramide (REGLAN) injection 10 mg (10 mg Intravenous Given 04/03/21 1841)  diphenhydrAMINE (BENADRYL) injection 12.5 mg (12.5 mg Intravenous Given 04/03/21 1841)  sodium chloride 0.9 % bolus 1,000 mL (1,000 mLs Intravenous New Bag/Given 04/03/21 1851)    ED Course  I have reviewed the triage vital signs and the  nursing notes.  Pertinent labs & imaging results that were available during my care of the patient were reviewed by me and considered in my medical decision making (see chart for details).  Clinical Course as of 04/03/21 1943  Thu Apr 03, 2021  1757 Creatinine: 0.84 [HK]  1757 WBC: 4.6 [HK]  1757 Hemoglobin: 13.5 [HK]    Clinical Course User Index [HK] Dietrich Pates, PA-C   MDM Rules/Calculators/A&P                          20 year old female presenting to the ED for migraine.  Migraine started 3 days ago.  Reports headache with associated photophobia, phonophobia.  She began having vomiting 2 days ago.  Headache persisted despite taking NSAIDs last night.  On exam abdomen is soft.  She has no meningeal signs.  She has no neurological deficits, no numbness, weakness or facial asymmetry noted.  She is in no acute distress.  No preceding head injury or anticoagulant use that would concern me for hemorrhage.  Lab work ordered in triage including CBC, CMP, hCG unremarkable.  Will treat with migraine cocktail, IV fluids and reassess  On recheck patient reports significant improvement in her headache.  She is tolerating p.o. intake without difficulty.  I suspect that her symptoms are due to a migraine. There are no headache characteristics that are lateralizing or concerning for increased ICP, infectious or vascular cause of her symptoms.  We will have her continue antiemetics at home, follow-up with primary care provider and return to the ER for any worsening symptoms.   Patient is hemodynamically stable, in NAD, and able to ambulate in the ED. Evaluation does not show pathology that  would require ongoing emergent intervention or inpatient treatment. I explained the diagnosis to the patient. Pain has been managed and has no complaints prior to discharge. Patient is comfortable with above plan and is stable for discharge at this time. All questions were answered prior to disposition. Strict return precautions for returning to the ED were discussed. Encouraged follow up with PCP.   An After Visit Summary was printed and given to the patient.   Portions of this note were generated with Scientist, clinical (histocompatibility and immunogenetics). Dictation errors may occur despite best attempts at proofreading.  Final Clinical Impression(s) / ED Diagnoses Final diagnoses:  Other migraine without status migrainosus, not intractable    Rx / DC Orders ED Discharge Orders          Ordered    ondansetron (ZOFRAN ODT) 4 MG disintegrating tablet  Every 8 hours PRN        04/03/21 1938             Dietrich Pates, PA-C 04/03/21 1943    Milagros Loll, MD 04/04/21 1202

## 2021-04-03 NOTE — ED Provider Notes (Signed)
Emergency Medicine Provider Triage Evaluation Note  Lisa Best , a 20 y.o. female  was evaluated in triage.  Pt complains of headache x3 days. Headache located behind bilateral eyes associated with photophobia and phonophobia. She also admits to a throbbing sensation to the posterior aspect of head. No history of headaches. No fever or chills. No neck stiffness. No recent head injury. She states for the past 2 days she has had numerous episodes of NBNB emesis and is unable to keep anything down. Admits to mild dry cough. No sick contacts or known COVID exposures. She has received 1 COVID vaccine.   Review of Systems  Positive: N, V, headache Negative: fever  Physical Exam  BP (!) 102/51 (BP Location: Left Arm)   Pulse 82   Temp 98.4 F (36.9 C)   Resp 14   Ht 5\' 5"  (1.651 m)   Wt 49.9 kg   LMP 04/03/2021   SpO2 100%   BMI 18.30 kg/m  Gen:   Awake, no distress   Resp:  Normal effort  MSK:   Moves extremities without difficulty  Other:    Medical Decision Making  Medically screening exam initiated at 2:15 PM.  Appropriate orders placed.  ZEA KOSTKA was informed that the remainder of the evaluation will be completed by another provider, this initial triage assessment does not replace that evaluation, and the importance of remaining in the ED until their evaluation is complete.  Labs ordered due to emesis and nausea. Normal neurological exam.    Lisa Foots, PA-C 04/03/21 1418    [place], 04/05/21, DO 04/03/21 1504

## 2021-04-03 NOTE — ED Triage Notes (Signed)
Pt reports severe migraine for the past 3 days along with emesis for the past 2 days. Pt unable to eat or drink anything so she started to feel very lethargic. Pt a.o in triage. 8/10 pain and nausea at this time

## 2021-04-03 NOTE — Discharge Instructions (Addendum)
Take Tylenol or ibuprofen if you have headaches. Take Zofran as needed for nausea or vomiting. Follow-up with your primary care provider or the 1 listed below. Return to the ER if you start to experience worsening headache, blurry vision, numbness in arms or legs or chest pain.

## 2021-04-11 ENCOUNTER — Emergency Department (HOSPITAL_COMMUNITY)
Admission: EM | Admit: 2021-04-11 | Discharge: 2021-04-11 | Disposition: A | Discharge status: Home / Self Care | Payer: Medicaid Other | Attending: Emergency Medicine | Admitting: Emergency Medicine

## 2021-04-11 ENCOUNTER — Encounter (HOSPITAL_COMMUNITY): Payer: Self-pay

## 2021-04-11 ENCOUNTER — Other Ambulatory Visit: Payer: Self-pay

## 2021-04-11 DIAGNOSIS — R109 Unspecified abdominal pain: Secondary | ICD-10-CM | POA: Diagnosis present

## 2021-04-11 DIAGNOSIS — N390 Urinary tract infection, site not specified: Secondary | ICD-10-CM | POA: Diagnosis not present

## 2021-04-11 LAB — URINALYSIS, ROUTINE W REFLEX MICROSCOPIC
Bacteria, UA: NONE SEEN
Bilirubin Urine: NEGATIVE
Glucose, UA: NEGATIVE mg/dL
Ketones, ur: NEGATIVE mg/dL
Nitrite: NEGATIVE
Protein, ur: 30 mg/dL — AB
RBC / HPF: >50 RBC/hpf — ABNORMAL HIGH (ref 0–5)
Specific Gravity, Urine: 1.018 (ref 1.005–1.030)
WBC, UA: >50 WBC/hpf — ABNORMAL HIGH (ref 0–5)
pH: 6 (ref 5.0–8.0)

## 2021-04-11 LAB — POC URINE PREG, ED: Preg Test, Ur: NEGATIVE

## 2021-04-11 MED ORDER — PHENAZOPYRIDINE HCL 200 MG PO TABS: 200.0000 mg | ORAL_TABLET | Freq: Two times a day (BID) | ORAL | 0 refills | Status: DC | PRN

## 2021-04-11 MED ORDER — CEPHALEXIN 500 MG PO CAPS: 500.0000 mg | ORAL_CAPSULE | Freq: Two times a day (BID) | ORAL | 0 refills | Status: DC

## 2021-04-11 NOTE — ED Provider Notes (Signed)
[facility]-EMERGENCY DEPT Provider Note   CSN: [id number] Arrival date & time: 04/11/21  1644     History Chief Complaint  Patient presents with   Abdominal Pain   Hematuria   Dysuria    Lisa Best is a 20 y.o. female with past medical history significant for Chlamydia who presents for evaluation of suprapubic tenderness, urinary frequency and dark urine. Symptoms x 2 days No focal RLQ, LLQ pain., no vaginal discharge. No concern for STD. Denies chance of pregnancy. No flank pain, back pain, hx of kidney stone. Has not taken anything for symptoms. Rates pain a 3/10. No fever, emesis, CP, diarrhea. Denies additional aggrieving or alleviating factors.  History obtained from patient and past medical records. No interpretor was used.  HPI     Past Medical History:  Diagnosis Date   Chlamydia 06/2016   treated   Medical history non-contributory     Patient Active Problem List   Diagnosis Date Noted   Encounter for elective induction of labor 11/26/2017   Nausea vomiting and diarrhea 11/16/2017    Past Surgical History:  Procedure Laterality Date   NO PAST SURGERIES       OB History     Gravida  1   Para  0   Term  0   Preterm  0   AB  0   Living  0      SAB  0   IAB  0   Ectopic  0   Multiple  0   Live Births              Family History  Problem Relation Age of Onset   Healthy Mother    Healthy Father     Social History   Tobacco Use   Smoking status: Never   Smokeless tobacco: Never  Vaping Use   Vaping Use: Never used  Substance Use Topics   Alcohol use: Yes   Drug use: Not Currently    Types: Marijuana    Comment: occasional    Home Medications Prior to Admission medications   Medication Sig Start Date End Date Taking? Authorizing Provider  cephALEXin (KEFLEX) 500 MG capsule Take 1 capsule (500 mg total) by mouth 2 (two) times daily. 04/11/21  Yes Twylla Arceneaux A, PA-C  phenazopyridine (PYRIDIUM)  200 MG tablet Take 1 tablet (200 mg total) by mouth 2 (two) times daily as needed for pain. 04/11/21  Yes Khristina Janota A, PA-C  EPINEPHrine (EPIPEN 2-PAK IJ) Inject 1 Applicatorful as directed once as needed (for severe allergic reaction).    [provider]  ondansetron (ZOFRAN ODT) 4 MG disintegrating tablet Take 1 tablet (4 mg total) by mouth every 8 (eight) hours as needed for nausea or vomiting. 04/03/21   Khatri, Hina, PA-C    Allergies    Bee venom  Review of Systems   Review of Systems  Constitutional: Negative.   HENT: Negative.    Respiratory: Negative.    Cardiovascular: Negative.   Gastrointestinal: Negative.   Genitourinary:  Positive for frequency and urgency. Negative for decreased urine volume, difficulty urinating, dysuria, flank pain, genital sores, hematuria, menstrual problem, pelvic pain, vaginal bleeding, vaginal discharge and vaginal pain.  Musculoskeletal: Negative.   Skin: Negative.   Neurological: Negative.   All other systems reviewed and are negative.  Physical Exam Updated Vital Signs BP (!) 132/99 (BP Location: Left Arm)   Pulse (!) 58   Temp 98.4 F (36.9 C) (Oral)  Resp 16   Ht 5\' 5"  (1.651 m)   Wt 49.9 kg   LMP 04/03/2021   SpO2 100%   BMI 18.30 kg/m   Physical Exam Vitals and nursing note reviewed.  Constitutional:      General: She is not in acute distress.    Appearance: She is well-developed. She is not ill-appearing, toxic-appearing or diaphoretic.  HENT:     Head: Normocephalic and atraumatic.     Mouth/Throat:     Mouth: Mucous membranes are moist.  Eyes:     Pupils: Pupils are equal, round, and reactive to light.  Cardiovascular:     Rate and Rhythm: Normal rate.     Heart sounds: Normal heart sounds.  Pulmonary:     Effort: Pulmonary effort is normal. No respiratory distress.     Breath sounds: Normal breath sounds.  Abdominal:     General: Bowel sounds are normal. There is no distension.     Palpations:  Abdomen is soft.     Tenderness: There is no abdominal tenderness. There is no right CVA tenderness, left CVA tenderness, guarding or rebound. Negative signs include Murphy's sign and McBurney's sign.     Hernia: No hernia is present.  Musculoskeletal:        General: Normal range of motion.     Cervical back: Normal range of motion.  Skin:    General: Skin is warm and dry.  Neurological:     General: No focal deficit present.     Mental Status: She is alert.  Psychiatric:        Mood and Affect: Mood normal.    ED Results / Procedures / Treatments   Labs (all labs ordered are listed, but only abnormal results are displayed) Labs Reviewed  URINALYSIS, ROUTINE W REFLEX MICROSCOPIC - Abnormal; Notable for the following components:      Result Value   APPearance CLOUDY (*)    Hgb urine dipstick MODERATE (*)    Protein, ur 30 (*)    Leukocytes,Ua LARGE (*)    RBC / HPF >50 (*)    WBC, UA >50 (*)    All other components within normal limits  POC URINE PREG, ED    EKG None  Radiology No results found.  Procedures Procedures   Medications Ordered in ED Medications - No data to display  ED Course  I have reviewed the triage vital signs and the nursing notes.  Pertinent labs & imaging results that were available during my care of the patient were reviewed by me and considered in my medical decision making (see chart for details).  Here for evaluation of urinary complaints.  He is afebrile, nonseptic, not ill-appearing.  Abdomen soft, nontender.  She has no concerns for any STDs, she has no vaginal discharge or pelvic pain.  CVA tap negative bilaterally.  She denies chance of pregnancy.  Urine positive for UTI.  Will DC home with Keflex.    On repeat exam patient does not have a surgical abdomin and there are no peritoneal signs.  No indication of appendicitis, bowel obstruction, bowel perforation, cholecystitis, diverticulitis, TOA, PID, infected kidney stone or ectopic  pregnancy.    The patient has been appropriately medically screened and/or stabilized in the ED. I have low suspicion for any other emergent medical condition which would require further screening, evaluation or treatment in the ED or require inpatient management.  Patient is hemodynamically stable and in no acute distress.  Patient able to ambulate in department  prior to ED.  Evaluation does not show acute pathology that would require ongoing or additional emergent interventions while in the emergency department or further inpatient treatment.  I have discussed the diagnosis with the patient and answered all questions.  Pain is been managed while in the emergency department and patient has no further complaints prior to discharge.  Patient is comfortable with plan discussed in room and is stable for discharge at this time.  I have discussed strict return precautions for returning to the emergency department.  Patient was encouraged to follow-up with PCP/specialist refer to at discharge.      MDM Rules/Calculators/A&P                            Final Clinical Impression(s) / ED Diagnoses Final diagnoses:  Lower urinary tract infectious disease    Rx / DC Orders ED Discharge Orders          Ordered    cephALEXin (KEFLEX) 500 MG capsule  2 times daily        04/11/21 1840    phenazopyridine (PYRIDIUM) 200 MG tablet  2 times daily PRN        04/11/21 1840             Doratha Mcswain A, PA-C 04/11/21 1841    Alvira Monday, MD 04/12/21 [phone]

## 2021-04-11 NOTE — ED Triage Notes (Addendum)
Patient c/o hematuria, dysuria, and lower abdominal pain x 2 days.

## 2021-04-11 NOTE — ED Provider Notes (Signed)
Emergency Medicine Provider Triage Evaluation Note  Westley Foots , a 20 y.o. female  was evaluated in triage.  Pt complains of suprapubic discomfort.  Has noted darker urine over the last few days.  Denies vaginal discharge, concerns for any STDs.  Denies chance of pregnancy.  No dysuria however states she feels like she cannot fully empty her bladder and needs to go more often.  Some urgency.  No focal left lower quadrant right lower quadrant pain.  No radiation to back.  Review of Systems  Positive: Suprapubic pain Negative: Flank pain, nausea, vomiting, fever  Physical Exam  BP (!) 132/99 (BP Location: Left Arm)   Pulse (!) 58   Temp 98.4 F (36.9 C) (Oral)   Resp 16   LMP 04/03/2021   SpO2 100%  Gen:   Awake, no distress   Resp:  Normal effort  MSK:   Moves extremities without difficulty  ABD:  Soft, mild tenderness at suprapubic region Other:    Medical Decision Making  Medically screening exam initiated at 5:07 PM.  Appropriate orders placed.  JOURNIE HOWSON was informed that the remainder of the evaluation will be completed by another provider, this initial triage assessment does not replace that evaluation, and the importance of remaining in the ED until their evaluation is complete.  Suprapubic pain, urinary frequency   Asa Fath A, PA-C 04/11/21 1708    Alvira Monday, MD 04/12/21 [phone]

## 2021-04-11 NOTE — Discharge Instructions (Addendum)
Take the antibiotics as prescribed. Return for new or worsening symptoms. °

## 2023-03-22 LAB — OB RESULTS CONSOLE GC/CHLAMYDIA
Chlamydia: NEGATIVE
Neisseria Gonorrhea: NEGATIVE

## 2023-04-19 LAB — OB RESULTS CONSOLE ANTIBODY SCREEN: Antibody Screen: NEGATIVE

## 2023-04-19 LAB — OB RESULTS CONSOLE HIV ANTIBODY (ROUTINE TESTING): HIV: NONREACTIVE

## 2023-04-19 LAB — HEPATITIS C ANTIBODY: HCV Ab: NEGATIVE

## 2023-04-19 LAB — OB RESULTS CONSOLE RUBELLA ANTIBODY, IGM: Rubella: IMMUNE

## 2023-04-19 LAB — OB RESULTS CONSOLE HEPATITIS B SURFACE ANTIGEN: Hepatitis B Surface Ag: NEGATIVE

## 2023-04-19 LAB — OB RESULTS CONSOLE RPR: RPR: NONREACTIVE

## 2023-09-13 ENCOUNTER — Inpatient Hospital Stay (HOSPITAL_COMMUNITY)
Admission: AD | Admit: 2023-09-13 | Discharge: 2023-09-13 | Disposition: A | Discharge status: Home / Self Care | Payer: Medicaid Other | Attending: Obstetrics and Gynecology | Admitting: Obstetrics and Gynecology

## 2023-09-13 ENCOUNTER — Encounter (HOSPITAL_COMMUNITY): Payer: Self-pay

## 2023-09-13 DIAGNOSIS — R55 Syncope and collapse: Secondary | ICD-10-CM

## 2023-09-13 DIAGNOSIS — O9A213 Injury, poisoning and certain other consequences of external causes complicating pregnancy, third trimester: Secondary | ICD-10-CM | POA: Diagnosis present

## 2023-09-13 DIAGNOSIS — O99353 Diseases of the nervous system complicating pregnancy, third trimester: Secondary | ICD-10-CM | POA: Insufficient documentation

## 2023-09-13 DIAGNOSIS — Z3A31 31 weeks gestation of pregnancy: Secondary | ICD-10-CM | POA: Diagnosis not present

## 2023-09-13 LAB — URINALYSIS, ROUTINE W REFLEX MICROSCOPIC
Bilirubin Urine: NEGATIVE
Glucose, UA: NEGATIVE mg/dL
Hgb urine dipstick: NEGATIVE
Ketones, ur: NEGATIVE mg/dL
Leukocytes,Ua: NEGATIVE
Nitrite: NEGATIVE
Protein, ur: 30 mg/dL — AB
Specific Gravity, Urine: 1.027 (ref 1.005–1.030)
pH: 5 (ref 5.0–8.0)

## 2023-09-13 LAB — CBC
HCT: 30.5 % — ABNORMAL LOW (ref 36.0–46.0)
Hemoglobin: 10.4 g/dL — ABNORMAL LOW (ref 12.0–15.0)
MCH: 24.3 pg — ABNORMAL LOW (ref 26.0–34.0)
MCHC: 34.1 g/dL (ref 30.0–36.0)
MCV: 71.3 fL — ABNORMAL LOW (ref 80.0–100.0)
Platelets: 236 10*3/uL (ref 150–400)
RBC: 4.28 MIL/uL (ref 3.87–5.11)
RDW: 12.9 % (ref 11.5–15.5)
WBC: 7.7 10*3/uL (ref 4.0–10.5)
nRBC: 0 % (ref 0.0–0.2)

## 2023-09-13 LAB — BASIC METABOLIC PANEL
Anion gap: 7 (ref 5–15)
BUN: 8 mg/dL (ref 6–20)
CO2: 20 mmol/L — ABNORMAL LOW (ref 22–32)
Calcium: 8.4 mg/dL — ABNORMAL LOW (ref 8.9–10.3)
Chloride: 104 mmol/L (ref 98–111)
Creatinine, Ser: 0.64 mg/dL (ref 0.44–1.00)
GFR, Estimated: >60 mL/min (ref >60)
Glucose, Bld: 84 mg/dL (ref 70–99)
Potassium: 4 mmol/L (ref 3.5–5.1)
Sodium: 131 mmol/L — ABNORMAL LOW (ref 135–145)

## 2023-09-13 NOTE — MAU Provider Note (Signed)
History     [id number]  Arrival date and time: 09/13/23 4098    Chief Complaint  Patient presents with   Loss of Consciousness     HPI Lisa Best is a 22 y.o. at [redacted]w[redacted]d with PMHx, who presents for episode of passing out.   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 She sat down on the floor and continued to have those symptoms as well as feeling hot and flushed, also seeing black spots She denies nausea Reports her coworkers were around her and she passed out for less than a minute, she did not hit her head No loss of bowel or bladder, no seizure like activity She woke up and came to MAU Has been eating and drinking just fine No abdominal pain No vaginal bleeding or leaking fluid No contractions Fetal movement is normal      OB History     Gravida  2   Para  0   Term  0   Preterm  0   AB  0   Living  1      SAB  0   IAB  0   Ectopic  0   Multiple  0   Live Births  1           Past Medical History:  Diagnosis Date   Chlamydia 06/2016   treated   Medical history non-contributory     Past Surgical History:  Procedure Laterality Date   NO PAST SURGERIES      Family History  Problem Relation Age of Onset   Healthy Mother    Healthy Father     Social History   Socioeconomic History   Marital status: Single    Spouse name: Not on file   Number of children: Not on file   Years of education: Not on file   Highest education level: Not on file  Occupational History   Not on file  Tobacco Use   Smoking status: Never   Smokeless tobacco: Never  Vaping Use   Vaping status: Never Used  Substance and Sexual Activity   Alcohol use: Not Currently   Drug use: Not Currently    Types: Marijuana    Comment: occasional   Sexual activity: Not Currently    Birth control/protection: None  Other Topics Concern   Not on file  Social History Narrative   Not on file   Social  Drivers of Health   Financial Resource Strain: Not on file  Food Insecurity: Not on file  Transportation Needs: Not on file  Physical Activity: Not on file  Stress: Not on file  Social Connections: Unknown (02/03/2022)   Received from [facility]   Social Network    Social Network: Not on file  Intimate Partner Violence: Unknown (12/26/2021)   Received from [facility]   HITS    Physically Hurt: Not on file    Insult or Talk Down To: Not on file    Threaten Physical Harm: Not on file    Scream or Curse: Not on file    Allergies  Allergen Reactions   Bee Venom Swelling    No current facility-administered medications on file prior to encounter.   Current Outpatient Medications on File Prior to Encounter  Medication Sig Dispense Refill   cephALEXin (KEFLEX) 500 MG capsule Take 1 capsule (500 mg total) by mouth 2 (two) times daily. 20 capsule 0  EPINEPHrine (EPIPEN 2-PAK IJ) Inject 1 Applicatorful as directed once as needed (for severe allergic reaction).     ondansetron (ZOFRAN ODT) 4 MG disintegrating tablet Take 1 tablet (4 mg total) by mouth every 8 (eight) hours as needed for nausea or vomiting. 4 tablet 0   phenazopyridine (PYRIDIUM) 200 MG tablet Take 1 tablet (200 mg total) by mouth 2 (two) times daily as needed for pain. 6 tablet 0     ROS Pertinent positives and negative per HPI, all others reviewed and negative  Physical Exam   BP 116/67 (BP Location: Right Arm)   Pulse (!) 101   Temp 97.9 F (36.6 C) (Oral)   Resp 16   Ht 5\' 5"  (1.651 m)   Wt 61.1 kg   SpO2 100%   BMI 22.40 kg/m   Patient Vitals for the past 24 hrs:  BP Temp Temp src Pulse Resp SpO2 Height Weight  09/13/23 1922 116/67 97.9 F (36.6 C) Oral (!) 101 16 100 % 5\' 5"  (1.651 m) 61.1 kg    Physical Exam Vitals reviewed.  Constitutional:      General: She is not in acute distress.    Appearance: She is well-developed. She is not diaphoretic.  Eyes:     General: No scleral  icterus. Cardiovascular:     Rate and Rhythm: Normal rate and regular rhythm.     Heart sounds: Normal heart sounds.  Pulmonary:     Effort: Pulmonary effort is normal. No respiratory distress.     Breath sounds: Normal breath sounds.  Abdominal:     General: There is no distension.     Palpations: Abdomen is soft.     Tenderness: There is no abdominal tenderness. There is no guarding or rebound.  Skin:    General: Skin is warm and dry.  Neurological:     Mental Status: She is alert.     Coordination: Coordination normal.      Cervical Exam    Bedside Ultrasound Not performed.  My interpretation: n/a  FHT Baseline: 150 bpm Variability: Good {> 6 bpm) Accelerations: Reactive Decelerations: Absent Uterine activity: None Cat: I  Labs Results for orders placed or performed during the hospital encounter of 09/13/23 (from the past 24 hours)  Urinalysis, Routine w reflex microscopic -Urine, Clean Catch     Status: Abnormal   Collection Time: 09/13/23  7:32 PM  Result Value Ref Range   Color, Urine YELLOW YELLOW   APPearance CLEAR CLEAR   Specific Gravity, Urine 1.027 1.005 - 1.030   pH 5.0 5.0 - 8.0   Glucose, UA NEGATIVE NEGATIVE mg/dL   Hgb urine dipstick NEGATIVE NEGATIVE   Bilirubin Urine NEGATIVE NEGATIVE   Ketones, ur NEGATIVE NEGATIVE mg/dL   Protein, ur 30 (A) NEGATIVE mg/dL   Nitrite NEGATIVE NEGATIVE   Leukocytes,Ua NEGATIVE NEGATIVE   RBC / HPF 0-5 0 - 5 RBC/hpf   WBC, UA 0-5 0 - 5 WBC/hpf   Bacteria, UA FEW (A) NONE SEEN   Squamous Epithelial / HPF 0-5 0 - 5 /HPF   Mucus PRESENT   CBC     Status: Abnormal   Collection Time: 09/13/23  7:35 PM  Result Value Ref Range   WBC 7.7 4.0 - 10.5 K/uL   RBC 4.28 3.87 - 5.11 MIL/uL   Hemoglobin 10.4 (L) 12.0 - 15.0 g/dL   HCT 24.4 (L) 01.0 - 27.2 %   MCV 71.3 (L) 80.0 - 100.0 fL   MCH 24.3 (L) 26.0 -  34.0 pg   MCHC 34.1 30.0 - 36.0 g/dL   RDW 28.4 13.2 - 44.0 %   Platelets 236 150 - 400 K/uL   nRBC 0.0 0.0  - 0.2 %  Basic metabolic panel     Status: Abnormal   Collection Time: 09/13/23  7:35 PM  Result Value Ref Range   Sodium 131 (L) 135 - 145 mmol/L   Potassium 4.0 3.5 - 5.1 mmol/L   Chloride 104 98 - 111 mmol/L   CO2 20 (L) 22 - 32 mmol/L   Glucose, Bld 84 70 - 99 mg/dL   BUN 8 6 - 20 mg/dL   Creatinine, Ser 1.02 0.44 - 1.00 mg/dL   Calcium 8.4 (L) 8.9 - 10.3 mg/dL   GFR, Estimated >72 >53 mL/min   Anion gap 7 5 - 15    Imaging No results found.  MAU Course  Procedures Lab Orders         Urinalysis, Routine w reflex microscopic -Urine, Clean Catch         CBC         Basic metabolic panel    No orders of the defined types were placed in this encounter.  Imaging Orders  No imaging studies ordered today    MDM Moderate (Level 3-4)  Assessment and Plan  # Vasovagal syncope #[redacted] weeks gestation of pregnancy History and exam c/w vasovagal etiology. Prodrome argues against cardiac etiology. No seizure like activity. ECG reviewed and personally interpreted and is unremarkable with no arrythmias and no ischemic changes. Orthostatics are unremarkable. CBC with only mild anemia, unlikely to be contributing. BMP also unremarkable. Discussed conservative measures to avoid in the future.   #FWB FHT Cat I NST: Reactive   Dispo: discharged to home in stable condition    Venora Maples, MD/MPH 09/13/23 8:19 PM  Allergies as of 09/13/2023       Reactions   Bee Venom Swelling        Medication List     TAKE these medications    cephALEXin 500 MG capsule Commonly known as: KEFLEX Take 1 capsule (500 mg total) by mouth 2 (two) times daily.   EPIPEN 2-PAK IJ Inject 1 Applicatorful as directed once as needed (for severe allergic reaction).   ondansetron 4 MG disintegrating tablet Commonly known as: Zofran ODT Take 1 tablet (4 mg total) by mouth every 8 (eight) hours as needed for nausea or vomiting.   phenazopyridine 200 MG tablet Commonly known as:  PYRIDIUM Take 1 tablet (200 mg total) by mouth 2 (two) times daily as needed for pain.

## 2023-09-13 NOTE — MAU Note (Signed)
..  Lisa Best is a 22 y.o. 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 Patient remembers waking up and having a crowd around her.  Still feels groggy and was seeing spots on the way here.  Denies pain, vaginal bleeding, leaking of fluid. Last movement was 2 hours ago Pain score: 0/10 Vitals:   09/13/23 1922  BP: 116/67  Pulse: (!) 101  Resp: 16  Temp: 97.9 F (36.6 C)  SpO2: 100%     FHT:155 Lab orders placed from triage: UA

## 2023-09-22 NOTE — L&D Delivery Note (Signed)
 Delivery Note Patient progressed spontaneously to complete. After approximately 20 minutes, at 4:04 AM a viable female was delivered via Vaginal, Spontaneous (Presentation:   Occiput Anterior). An episiotomy was cut at the site of her prior laceration after patient consent to facilitate delivery of fetal head. Nuchal x 1 was reduced. Baby was placed on mother's abdomen. APGAR: 8, 9; weight pending. Her episiotomy was repaired in routine fashion with 2-0 vicryl.   Her placenta remained in situ after 30 minutes of active management of the third stage. It was manually removed and trailing membranes removed with a Banjo currette. The uterus was boggy after removal of the placenta which improved with tranexemic acid. A uterine sweep revealed no further membranes and good uterine tone.    Placenta status: manual removal, bilobed. Sent to pathology   Cord: 3 vessels with the following complications: None.  Cord pH: N/A  Anesthesia: Epidural Episiotomy: Left Mediolateral Lacerations: 2nd degree Suture Repair: 2.0 vicryl Est. Blood Loss (mL):  278  Mom to postpartum.  Baby to Couplet care / Skin to Skin.  Lisa Best 10/27/2023, 4:54 AM

## 2023-09-29 ENCOUNTER — Encounter (HOSPITAL_COMMUNITY): Payer: Self-pay | Admitting: Obstetrics and Gynecology

## 2023-09-29 ENCOUNTER — Inpatient Hospital Stay (HOSPITAL_COMMUNITY)
Admission: AD | Admit: 2023-09-29 | Discharge: 2023-09-29 | Disposition: A | Discharge status: Home / Self Care | Payer: Medicaid Other | Attending: Obstetrics and Gynecology | Admitting: Obstetrics and Gynecology

## 2023-09-29 ENCOUNTER — Other Ambulatory Visit: Payer: Self-pay

## 2023-09-29 DIAGNOSIS — Z3A33 33 weeks gestation of pregnancy: Secondary | ICD-10-CM | POA: Diagnosis not present

## 2023-09-29 DIAGNOSIS — O23593 Infection of other part of genital tract in pregnancy, third trimester: Secondary | ICD-10-CM | POA: Diagnosis not present

## 2023-09-29 DIAGNOSIS — O26853 Spotting complicating pregnancy, third trimester: Secondary | ICD-10-CM | POA: Insufficient documentation

## 2023-09-29 DIAGNOSIS — O26893 Other specified pregnancy related conditions, third trimester: Secondary | ICD-10-CM | POA: Insufficient documentation

## 2023-09-29 DIAGNOSIS — B9689 Other specified bacterial agents as the cause of diseases classified elsewhere: Secondary | ICD-10-CM | POA: Insufficient documentation

## 2023-09-29 LAB — WET PREP, GENITAL
Sperm: NONE SEEN
Trich, Wet Prep: NONE SEEN
WBC, Wet Prep HPF POC: >=10 — AB (ref <10)
Yeast Wet Prep HPF POC: NONE SEEN

## 2023-09-29 LAB — URINALYSIS, ROUTINE W REFLEX MICROSCOPIC
Bilirubin Urine: NEGATIVE
Glucose, UA: NEGATIVE mg/dL
Ketones, ur: NEGATIVE mg/dL
Nitrite: NEGATIVE
Protein, ur: 30 mg/dL — AB
Specific Gravity, Urine: 1.021 (ref 1.005–1.030)
WBC, UA: >50 WBC/hpf (ref 0–5)
pH: 6 (ref 5.0–8.0)

## 2023-09-29 MED ORDER — METRONIDAZOLE 500 MG PO TABS: 500.0000 mg | ORAL_TABLET | Freq: Two times a day (BID) | ORAL | 0 refills | Status: DC

## 2023-09-29 NOTE — MAU Provider Note (Signed)
 Chief Complaint:  Spotting and Cramping   HPI   Event Date/Time   First Provider Initiated Contact with Patient 09/29/23 0958      Lisa Best is a 23 y.o. G2P0001 at [redacted]w[redacted]d who presents to maternity admissions reporting c/o losing her mucus plug and spotting when she wiped after using the BR. Denies recent intercourse or vaginal exams. Denies any active VB, LOF, cramping and reports good FM's   Pregnancy Course: [facility]  Past Medical History:  Diagnosis Date   Chlamydia 06/2016   treated   Medical history non-contributory    OB History  Gravida Para Term Preterm AB Living  2 1 0 0 0 1  SAB IAB Ectopic Multiple Live Births  0 0 0 0 1    # Outcome Date GA Lbr Len/2nd Weight Sex Type Anes PTL Lv  2 Current           1 Para 2019    M Vag-Spont   LIV   Past Surgical History:  Procedure Laterality Date   NO PAST SURGERIES     Family History  Problem Relation Age of Onset   Healthy Mother    Healthy Father    Social History   Tobacco Use   Smoking status: Never   Smokeless tobacco: Never  Vaping Use   Vaping status: Never Used  Substance Use Topics   Alcohol use: Not Currently   Drug use: Not Currently    Types: Marijuana    Comment: occasional   Allergies  Allergen Reactions   Bee Venom Swelling   Medications Prior to Admission  Medication Sig Dispense Refill Last Dose/Taking   ondansetron  (ZOFRAN  ODT) 4 MG disintegrating tablet Take 1 tablet (4 mg total) by mouth every 8 (eight) hours as needed for nausea or vomiting. 4 tablet 0 Past Month   Prenatal Vit-Fe Fumarate-FA (PRENATAL MULTIVITAMIN) TABS tablet Take 1 tablet by mouth daily at 12 noon.   09/28/2023   cephALEXin  (KEFLEX ) 500 MG capsule Take 1 capsule (500 mg total) by mouth 2 (two) times daily. 20 capsule 0 Unknown   EPINEPHrine  (EPIPEN  2-PAK IJ) Inject 1 Applicatorful as directed once as needed (for severe allergic reaction).   Unknown   phenazopyridine  (PYRIDIUM ) 200 MG tablet Take 1  tablet (200 mg total) by mouth 2 (two) times daily as needed for pain. 6 tablet 0 Unknown    I have reviewed patient's Past Medical Hx, Surgical Hx, Family Hx, Social Hx, medications and allergies.   ROS  Pertinent items noted in HPI and remainder of comprehensive ROS otherwise negative.   PHYSICAL EXAM  Patient Vitals for the past 24 hrs:  BP Temp Temp src Pulse Resp SpO2 Height Weight  09/29/23 1106 111/71 -- -- 93 16 -- -- --  09/29/23 0950 -- -- -- -- -- 98 % -- --  09/29/23 0945 100/75 -- -- 98 16 -- -- --  09/29/23 0930 105/77 98 F (36.7 C) Oral (!) 101 18 99 % -- --  09/29/23 0925 -- -- -- -- -- -- 5' 5 (1.651 m) 61.8 kg    Constitutional: Well-developed, well-nourished female in no acute distress.  Cardiovascular: normal rate & rhythm, warm and well-perfused Respiratory: normal effort, no problems with respiration noted GI: Abd soft, non-tender, non-distended MS: Extremities nontender, no edema, normal ROM Neurologic: Alert and oriented x 4.  GU: no CVA tenderness Pelvic : SVE Long/Closed  and no blood visualized   Dilation: Closed Effacement (%): Thick Cervical Position:  Posterior Exam by:: L. Kailia Starry, NP  Fetal Tracing: CAT 1  Baseline: 150 Variability: Moderate Accelerations: present Decelerations:None Toco: 1 mild contraction noted    Labs: Results for orders placed or performed during the hospital encounter of 09/29/23 (from the past 24 hours)  Urinalysis, Routine w reflex microscopic -Urine, Clean Catch     Status: Abnormal   Collection Time: 09/29/23  9:42 AM  Result Value Ref Range   Color, Urine YELLOW YELLOW   APPearance CLOUDY (A) CLEAR   Specific Gravity, Urine 1.021 1.005 - 1.030   pH 6.0 5.0 - 8.0   Glucose, UA NEGATIVE NEGATIVE mg/dL   Hgb urine dipstick MODERATE (A) NEGATIVE   Bilirubin Urine NEGATIVE NEGATIVE   Ketones, ur NEGATIVE NEGATIVE mg/dL   Protein, ur 30 (A) NEGATIVE mg/dL   Nitrite NEGATIVE NEGATIVE   Leukocytes,Ua LARGE (A)  NEGATIVE   RBC / HPF 6-10 0 - 5 RBC/hpf   WBC, UA >50 0 - 5 WBC/hpf   Bacteria, UA MANY (A) NONE SEEN   Squamous Epithelial / HPF 21-50 0 - 5 /HPF   Mucus PRESENT    Amorphous Crystal PRESENT   Wet prep, genital     Status: Abnormal   Collection Time: 09/29/23 10:11 AM  Result Value Ref Range   Yeast Wet Prep HPF POC NONE SEEN NONE SEEN   Trich, Wet Prep NONE SEEN NONE SEEN   Clue Cells Wet Prep HPF POC PRESENT (A) NONE SEEN   WBC, Wet Prep HPF POC >=10 (A) <10   Sperm NONE SEEN       MDM & MAU COURSE  MDM:  MODERATE    MAU Course: Orders Placed This Encounter  Procedures   Wet prep, genital   Culture, OB Urine   Urinalysis, Routine w reflex microscopic -Urine, Clean Catch   Discharge patient      GC/Chlamydia cx sent  ASSESSMENT   Problem List Items Addressed This Visit   None Visit Diagnoses       Bacterial vaginosis    -  Primary   Relevant Medications   metroNIDAZOLE  (FLAGYL ) 500 MG tablet   Other Relevant Orders   Discharge patient     Spotting affecting pregnancy in third trimester         [redacted] weeks gestation of pregnancy       Relevant Orders   Discharge patient       Has OB F/U scheduled 1/9 at [facility]  Discharge home in stable condition with return precautions.      Allergies as of 09/29/2023       Reactions   Bee Venom Swelling        Medication List     TAKE these medications    cephALEXin  500 MG capsule Commonly known as: KEFLEX  Take 1 capsule (500 mg total) by mouth 2 (two) times daily.   EPIPEN  2-PAK IJ Inject 1 Applicatorful as directed once as needed (for severe allergic reaction).   metroNIDAZOLE  500 MG tablet Commonly known as: FLAGYL  Take 1 tablet (500 mg total) by mouth 2 (two) times daily.   ondansetron  4 MG disintegrating tablet Commonly known as: Zofran  ODT Take 1 tablet (4 mg total) by mouth every 8 (eight) hours as needed for nausea or vomiting.   phenazopyridine  200 MG tablet Commonly known as:  PYRIDIUM  Take 1 tablet (200 mg total) by mouth 2 (two) times daily as needed for pain.   prenatal multivitamin Tabs tablet Take 1 tablet  by mouth daily at 12 noon.       Olam Dalton, MSN, [facility], [facility] [employer]

## 2023-09-29 NOTE — MAU Note (Signed)
 Lisa Best is a 23 y.o. at [redacted]w[redacted]d here in MAU reporting: she began spotting this morning with wiping.  Denies recent intercourse.  Reports also having tolerable menstrual type cramping.  Denies LOF.  Endorses +FM.  LMP: NA Onset of complaint: today Pain score: 5 Vitals:   09/29/23 0930  BP: 105/77  Pulse: (!) 101  Resp: 18  Temp: 98 F (36.7 C)  SpO2: 99%     QYU:izqzmmzi d/t maternal apparel Lab orders placed from triage: UA

## 2023-09-30 LAB — GC/CHLAMYDIA PROBE AMP (~~LOC~~) NOT AT ARMC
Chlamydia: NEGATIVE
Comment: NEGATIVE
Comment: NORMAL
Neisseria Gonorrhea: NEGATIVE

## 2023-10-13 LAB — OB RESULTS CONSOLE GBS: GBS: NEGATIVE

## 2023-10-26 ENCOUNTER — Encounter (HOSPITAL_COMMUNITY): Payer: Self-pay | Admitting: Student

## 2023-10-26 ENCOUNTER — Encounter (HOSPITAL_COMMUNITY): Payer: Self-pay | Admitting: *Deleted

## 2023-10-26 ENCOUNTER — Telehealth (HOSPITAL_COMMUNITY): Payer: Self-pay | Admitting: *Deleted

## 2023-10-26 ENCOUNTER — Other Ambulatory Visit: Payer: Self-pay

## 2023-10-26 ENCOUNTER — Inpatient Hospital Stay (HOSPITAL_COMMUNITY)
Admission: AD | Admit: 2023-10-26 | Discharge: 2023-10-29 | DRG: 797 | Disposition: A | Discharge status: Home / Self Care | Payer: Medicaid Other | Attending: Obstetrics and Gynecology | Admitting: Obstetrics and Gynecology

## 2023-10-26 DIAGNOSIS — O9081 Anemia of the puerperium: Secondary | ICD-10-CM | POA: Diagnosis not present

## 2023-10-26 DIAGNOSIS — Z3A37 37 weeks gestation of pregnancy: Secondary | ICD-10-CM | POA: Diagnosis not present

## 2023-10-26 DIAGNOSIS — O4292 Full-term premature rupture of membranes, unspecified as to length of time between rupture and onset of labor: Secondary | ICD-10-CM | POA: Diagnosis present

## 2023-10-26 DIAGNOSIS — D62 Acute posthemorrhagic anemia: Secondary | ICD-10-CM | POA: Diagnosis not present

## 2023-10-26 DIAGNOSIS — O365930 Maternal care for other known or suspected poor fetal growth, third trimester, not applicable or unspecified: Secondary | ICD-10-CM | POA: Diagnosis present

## 2023-10-26 DIAGNOSIS — O4290 Premature rupture of membranes, unspecified as to length of time between rupture and onset of labor, unspecified weeks of gestation: Principal | ICD-10-CM | POA: Diagnosis present

## 2023-10-26 DIAGNOSIS — Z148 Genetic carrier of other disease: Secondary | ICD-10-CM | POA: Diagnosis not present

## 2023-10-26 LAB — CBC
HCT: 28 % — ABNORMAL LOW (ref 36.0–46.0)
Hemoglobin: 9.4 g/dL — ABNORMAL LOW (ref 12.0–15.0)
MCH: 22.9 pg — ABNORMAL LOW (ref 26.0–34.0)
MCHC: 33.6 g/dL (ref 30.0–36.0)
MCV: 68.1 fL — ABNORMAL LOW (ref 80.0–100.0)
Platelets: 246 10*3/uL (ref 150–400)
RBC: 4.11 MIL/uL (ref 3.87–5.11)
RDW: 15.5 % (ref 11.5–15.5)
WBC: 9.3 10*3/uL (ref 4.0–10.5)
nRBC: 0 % (ref 0.0–0.2)

## 2023-10-26 LAB — TYPE AND SCREEN
ABO/RH(D): A POS
Antibody Screen: NEGATIVE

## 2023-10-26 LAB — POCT FERN TEST: POCT Fern Test: POSITIVE

## 2023-10-26 MED ORDER — LIDOCAINE HCL (PF) 1 % IJ SOLN: 30.0000 mL | INTRAMUSCULAR | Status: DC | PRN

## 2023-10-26 MED ORDER — OXYTOCIN BOLUS FROM INFUSION
333.0000 mL | Freq: Once | INTRAVENOUS | Status: AC
  Administered 2023-10-27: 333 mL via INTRAVENOUS

## 2023-10-26 MED ORDER — OXYCODONE-ACETAMINOPHEN 5-325 MG PO TABS: 1.0000 | ORAL_TABLET | ORAL | Status: DC | PRN

## 2023-10-26 MED ORDER — SODIUM CHLORIDE 0.9 % IV SOLN: 250.0000 mL | INTRAVENOUS | Status: DC | PRN

## 2023-10-26 MED ORDER — OXYTOCIN-SODIUM CHLORIDE 30-0.9 UT/500ML-% IV SOLN
2.5000 [IU]/h | INTRAVENOUS | Status: DC
Start: 2023-10-27 — End: 2023-10-27
  Administered 2023-10-27: 2.5 [IU]/h via INTRAVENOUS
  Filled 2023-10-26: qty 500

## 2023-10-26 MED ORDER — LACTATED RINGERS IV SOLN: 500.0000 mL | INTRAVENOUS | Status: DC | PRN

## 2023-10-26 MED ORDER — SOD CITRATE-CITRIC ACID 500-334 MG/5ML PO SOLN: 30.0000 mL | ORAL | Status: DC | PRN

## 2023-10-26 MED ORDER — TERBUTALINE SULFATE 1 MG/ML IJ SOLN: 0.2500 mg | Freq: Once | INTRAMUSCULAR | Status: DC | PRN

## 2023-10-26 MED ORDER — SODIUM CHLORIDE 0.9% FLUSH: 3.0000 mL | INTRAVENOUS | Status: DC | PRN

## 2023-10-26 MED ORDER — SODIUM CHLORIDE 0.9% FLUSH: 3.0000 mL | Freq: Two times a day (BID) | INTRAVENOUS | Status: DC

## 2023-10-26 MED ORDER — OXYCODONE-ACETAMINOPHEN 5-325 MG PO TABS: 2.0000 | ORAL_TABLET | ORAL | Status: DC | PRN

## 2023-10-26 MED ORDER — ACETAMINOPHEN 325 MG PO TABS: 650.0000 mg | ORAL_TABLET | ORAL | Status: DC | PRN

## 2023-10-26 MED ORDER — MISOPROSTOL 50MCG HALF TABLET: 50.0000 ug | ORAL_TABLET | Freq: Four times a day (QID) | ORAL | Status: DC

## 2023-10-26 MED ORDER — ONDANSETRON HCL 4 MG/2ML IJ SOLN: 4.0000 mg | Freq: Four times a day (QID) | INTRAMUSCULAR | Status: DC | PRN

## 2023-10-26 NOTE — H&P (Signed)
 Lisa Best is a 23 y.o. female presenting for rupture of membranes. Reports leakage of clear fluid beginning at 1900 with continued leakage since. Fern positive in MAU.   +Fms. Denies VB. Contractions were not present after ROM but are now becoming more frequent and intense  Pregnancy is complicated by:  - FGR: last growth 12/18. EFW 27%, AC <2.3%. 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 Social History:  reports that she has never smoked. She has never used smokeless tobacco. She reports that she does not currently use alcohol. She reports that she does not currently use drugs after having used the following drugs: Marijuana.     Maternal Diabetes: No Genetic Screening: Normal Maternal Ultrasounds/Referrals: FGR Fetal Ultrasounds or other Referrals:  None Maternal Substance Abuse:  No Significant Maternal Medications:  None Significant Maternal Lab Results:  Group B Strep negative Number of Prenatal Visits:greater than 3 verified prenatal visits Maternal Vaccinations:RSV: Given during pregnancy <14 days ago, TDap, and Flu Other Comments:  None  Review of Systems  Constitutional:  Negative for chills and fever.  Respiratory:  Negative for chest tightness and shortness of breath.   Cardiovascular:  Negative for chest pain.  Gastrointestinal:  Positive for abdominal pain.  Genitourinary:  Positive for pelvic pain and vaginal discharge. Negative for vaginal bleeding.  Neurological:  Negative for light-headedness and headaches.     Blood pressure  125/78, pulse (!) 115, temperature 98.4 F (36.9 C), temperature source Oral, resp. rate 18, height 5' 5 (1.651 m), weight 64.4 kg, SpO2 98%, unknown if currently breastfeeding. Exam Physical Exam Constitutional:      General: She is not in acute distress.    Appearance: Normal appearance.  HENT:     Head: Normocephalic and atraumatic.  Pulmonary:     Effort: Pulmonary effort is normal.  Genitourinary:    Comments: SVE: 1/80/-3, clear fluid Musculoskeletal:        General: Normal range of motion.  Skin:    General: Skin is warm and dry.  Neurological:     Mental Status: She is alert.  Psychiatric:        Mood and Affect: Mood normal.        Behavior: Behavior normal.     Prenatal labs: ABO, Rh:  A positive Antibody: Negative (07/29 0000) Rubella: Immune (07/29 0000) RPR: Nonreactive (07/29 0000)  HBsAg: Negative (07/29 0000)  HIV: Non-reactive (07/29 0000)  GBS: Negative/-- (01/22 0000)   Assessment/Plan: 23 yo G2P1001 @ 37.3 presents with PROM - Contracting regularly after some time in MAU. Oral cytotec  vs pitocin  if CTX space out - Nitrous oxide for pain control, planning later epidural - GBS: negative   [name] Sharps 10/26/2023, 8:39 PM

## 2023-10-26 NOTE — MAU Note (Signed)
.  Lisa Best is a 23 y.o. at [redacted]w[redacted]d here in MAU reporting: SROM at 61 - clear fluid and still leaking some. Denies VB. Reports mild cramps. +FM.   Onset of complaint: 1900 Pain score: 4 Vitals:   10/26/23 2020  BP: 125/78  Pulse: (!) 115  Resp: 18  Temp: 98.4 F (36.9 C)  SpO2: 98%     FHT: 140  Lab orders placed from triage: labor eval

## 2023-10-26 NOTE — Telephone Encounter (Signed)
 Preadmission screen

## 2023-10-26 NOTE — MAU Note (Signed)

## 2023-10-27 ENCOUNTER — Encounter (HOSPITAL_COMMUNITY): Payer: Self-pay | Admitting: Student

## 2023-10-27 ENCOUNTER — Inpatient Hospital Stay (HOSPITAL_COMMUNITY): Payer: Medicaid Other | Admitting: Anesthesiology

## 2023-10-27 LAB — CBC
HCT: 23.1 % — ABNORMAL LOW (ref 36.0–46.0)
Hemoglobin: 7.8 g/dL — ABNORMAL LOW (ref 12.0–15.0)
MCH: 22.5 pg — ABNORMAL LOW (ref 26.0–34.0)
MCHC: 33.8 g/dL (ref 30.0–36.0)
MCV: 66.8 fL — ABNORMAL LOW (ref 80.0–100.0)
Platelets: 212 10*3/uL (ref 150–400)
RBC: 3.46 MIL/uL — ABNORMAL LOW (ref 3.87–5.11)
RDW: 15.5 % (ref 11.5–15.5)
WBC: 10.2 10*3/uL (ref 4.0–10.5)
nRBC: 0 % (ref 0.0–0.2)

## 2023-10-27 LAB — RPR: RPR Ser Ql: NONREACTIVE

## 2023-10-27 MED ORDER — ONDANSETRON HCL 4 MG/2ML IJ SOLN: 4.0000 mg | INTRAMUSCULAR | Status: DC | PRN

## 2023-10-27 MED ORDER — EPHEDRINE 5 MG/ML INJ: 10.0000 mg | INTRAVENOUS | Status: DC | PRN

## 2023-10-27 MED ORDER — TRANEXAMIC ACID-NACL 1000-0.7 MG/100ML-% IV SOLN: 1000.0000 mg | INTRAVENOUS | Status: AC

## 2023-10-27 MED ORDER — LACTATED RINGERS IV SOLN
500.0000 mL | Freq: Once | INTRAVENOUS | Status: DC
Start: 2023-10-27 — End: 2023-10-27

## 2023-10-27 MED ORDER — PHENYLEPHRINE 80 MCG/ML (10ML) SYRINGE FOR IV PUSH (FOR BLOOD PRESSURE SUPPORT): 80.0000 ug | PREFILLED_SYRINGE | INTRAVENOUS | Status: DC | PRN

## 2023-10-27 MED ORDER — FENTANYL-BUPIVACAINE-NACL 0.5-0.125-0.9 MG/250ML-% EP SOLN
12.0000 mL/h | EPIDURAL | Status: DC | PRN
  Administered 2023-10-27: 12 mL/h via EPIDURAL
  Filled 2023-10-27: qty 250

## 2023-10-27 MED ORDER — METRONIDAZOLE 500 MG/100ML IV SOLN
500.0000 mg | Freq: Once | INTRAVENOUS | Status: AC
  Administered 2023-10-27: 500 mg via INTRAVENOUS
  Filled 2023-10-27: qty 100

## 2023-10-27 MED ORDER — DIBUCAINE (PERIANAL) 1 % EX OINT: 1.0000 | TOPICAL_OINTMENT | CUTANEOUS | Status: DC | PRN

## 2023-10-27 MED ORDER — CEFAZOLIN SODIUM-DEXTROSE 1-4 GM/50ML-% IV SOLN
1.0000 g | Freq: Once | INTRAVENOUS | Status: AC
  Administered 2023-10-27: 1 g via INTRAVENOUS
  Filled 2023-10-27: qty 50

## 2023-10-27 MED ORDER — SIMETHICONE 80 MG PO CHEW: 80.0000 mg | CHEWABLE_TABLET | ORAL | Status: DC | PRN

## 2023-10-27 MED ORDER — ZOLPIDEM TARTRATE 5 MG PO TABS: 5.0000 mg | ORAL_TABLET | Freq: Every evening | ORAL | Status: DC | PRN

## 2023-10-27 MED ORDER — TRANEXAMIC ACID-NACL 1000-0.7 MG/100ML-% IV SOLN
INTRAVENOUS | Status: AC
  Administered 2023-10-27: 1000 mg
  Filled 2023-10-27: qty 100

## 2023-10-27 MED ORDER — BENZOCAINE-MENTHOL 20-0.5 % EX AERO
1.0000 | INHALATION_SPRAY | CUTANEOUS | Status: DC | PRN
  Administered 2023-10-27: 1 via TOPICAL
  Filled 2023-10-27: qty 56

## 2023-10-27 MED ORDER — ONDANSETRON HCL 4 MG PO TABS: 4.0000 mg | ORAL_TABLET | ORAL | Status: DC | PRN

## 2023-10-27 MED ORDER — LACTATED RINGERS IV SOLN
500.0000 mL | Freq: Once | INTRAVENOUS | Status: AC
Start: 2023-10-27 — End: 2023-10-27
  Administered 2023-10-27: 500 mL via INTRAVENOUS

## 2023-10-27 MED ORDER — DIPHENHYDRAMINE HCL 25 MG PO CAPS: 25.0000 mg | ORAL_CAPSULE | Freq: Four times a day (QID) | ORAL | Status: DC | PRN

## 2023-10-27 MED ORDER — PRENATAL MULTIVITAMIN CH
1.0000 | ORAL_TABLET | Freq: Every day | ORAL | Status: DC
  Administered 2023-10-27 – 2023-10-29 (×3): 1 via ORAL
  Filled 2023-10-27 (×3): qty 1

## 2023-10-27 MED ORDER — WITCH HAZEL-GLYCERIN EX PADS: 1.0000 | MEDICATED_PAD | CUTANEOUS | Status: DC | PRN

## 2023-10-27 MED ORDER — IBUPROFEN 600 MG PO TABS
600.0000 mg | ORAL_TABLET | Freq: Four times a day (QID) | ORAL | Status: DC
  Administered 2023-10-27 – 2023-10-29 (×10): 600 mg via ORAL
  Filled 2023-10-27 (×10): qty 1

## 2023-10-27 MED ORDER — TETANUS-DIPHTH-ACELL PERTUSSIS 5-2.5-18.5 LF-MCG/0.5 IM SUSY: 0.5000 mL | PREFILLED_SYRINGE | Freq: Once | INTRAMUSCULAR | Status: DC

## 2023-10-27 MED ORDER — SENNOSIDES-DOCUSATE SODIUM 8.6-50 MG PO TABS
2.0000 | ORAL_TABLET | Freq: Every day | ORAL | Status: DC
  Administered 2023-10-28 – 2023-10-29 (×2): 2 via ORAL
  Filled 2023-10-27 (×2): qty 2

## 2023-10-27 MED ORDER — ACETAMINOPHEN 325 MG PO TABS: 650.0000 mg | ORAL_TABLET | ORAL | Status: DC | PRN

## 2023-10-27 MED ORDER — COCONUT OIL OIL: 1.0000 | TOPICAL_OIL | Status: DC | PRN

## 2023-10-27 MED ORDER — DIPHENHYDRAMINE HCL 50 MG/ML IJ SOLN: 12.5000 mg | INTRAMUSCULAR | Status: DC | PRN

## 2023-10-27 MED ORDER — LIDOCAINE HCL (PF) 1 % IJ SOLN
INTRAMUSCULAR | Status: DC | PRN
  Administered 2023-10-27: 5 mL via EPIDURAL
  Administered 2023-10-27: 3 mL via EPIDURAL
  Administered 2023-10-27: 2 mL via EPIDURAL

## 2023-10-27 NOTE — Anesthesia Preprocedure Evaluation (Signed)
 Anesthesia Evaluation  Patient identified by MRN, date of birth, ID band Patient awake    Reviewed: Allergy & Precautions, Patient's Chart, lab work & pertinent test results  Airway Mallampati: II  TM Distance: >3 FB Neck ROM: Full    Dental   Pulmonary neg pulmonary ROS   breath sounds clear to auscultation       Cardiovascular negative cardio ROS  Rhythm:Regular Rate:Normal     Neuro/Psych negative neurological ROS     GI/Hepatic negative GI ROS, Neg liver ROS,,,  Endo/Other  negative endocrine ROS    Renal/GU negative Renal ROS     Musculoskeletal   Abdominal   Peds  Hematology  (+) Blood dyscrasia, anemia   Anesthesia Other Findings   Reproductive/Obstetrics (+) Pregnancy                             Anesthesia Physical Anesthesia Plan  ASA: 2  Anesthesia Plan: Epidural   Post-op Pain Management:    Induction:   PONV Risk Score and Plan: 2 and Treatment may vary due to age or medical condition  Airway Management Planned: Natural Airway  Additional Equipment:   Intra-op Plan:   Post-operative Plan:   Informed Consent: I have reviewed the patients History and Physical, chart, labs and discussed the procedure including the risks, benefits and alternatives for the proposed anesthesia with the patient or authorized representative who has indicated his/her understanding and acceptance.       Plan Discussed with:   Anesthesia Plan Comments:        Anesthesia Quick Evaluation

## 2023-10-27 NOTE — Anesthesia Procedure Notes (Signed)
 Epidural Patient location during procedure: OB Start time: 10/27/2023 1:40 AM End time: 10/27/2023 1:47 AM  Staffing Anesthesiologist: Epifanio Charleston, MD Performed: anesthesiologist   Preanesthetic Checklist Completed: patient identified, IV checked, site marked, risks and benefits discussed, surgical consent, monitors and equipment checked, pre-op evaluation and timeout performed  Epidural Patient position: sitting Prep: DuraPrep and site prepped and draped Patient monitoring: continuous pulse ox and blood pressure Approach: midline Injection technique: LOR air  Needle:  Needle type: Tuohy  Needle gauge: 17 G Needle length: 9 cm and 9 Needle insertion depth: 5 cm cm Catheter type: closed end flexible Catheter size: 19 Gauge Catheter at skin depth: 10 cm Test dose: negative  Assessment Events: blood not aspirated, no cerebrospinal fluid, injection not painful, no injection resistance, no paresthesia and negative IV test

## 2023-10-27 NOTE — Lactation Note (Signed)
 This note was copied from a baby's chart. Lactation Consultation Note  Patient Name: Lisa Best Date: 10/27/2023 Age:25 hours, P2 experienced  Reason for consult: Initial assessment;Infant < 6lbs;Early term 37-38.6wks;Breastfeeding assistance As LC entered the room baby latched in the cradle shallow and released.  LC offered to assist to latch and mom receptive.  LC assisted to latch in the cross cradle and worked on the depth.  Swallows noted and increased with breast compressions. Per mom cramping, but the latch is comfortable.  LC reviewed Breast feeding quide lines for ET, less than 6 pounds feeding policy and the need to supplement and post pump for 15 mins. Save milk for the next feeding.  LC reviewed the Feeding care plan.  See below for details.    Maternal Data Has patient been taught Hand Expression?: Yes Does the patient have breastfeeding experience prior to this delivery?: Yes How long did the patient breastfeed?: per mom 1st baby 2 years BF and now 71 years old  Feeding  Breast ,  Breast/ formula due to ET   LATCH Score Latch: Grasps breast easily, tongue down, lips flanged, rhythmical sucking.  Audible Swallowing: Spontaneous and intermittent  Type of Nipple: Everted at rest and after stimulation  Comfort (Breast/Nipple): Soft / non-tender  Hold (Positioning): Assistance needed to correctly position infant at breast and maintain latch.  LATCH Score: 9   Lactation Tools Discussed/Used - #21 F DEBP  Pump Education: Milk Storage Reason for Pumping: ET, Less than 6 pounds (LC will be setting the DEBP up after mom eats her breakfast) LC came back and set the DEBP up and checked the flange size.  Mom holding baby on her right side so LC could check the flange size.  Interventions Interventions: Breast feeding basics reviewed;Assisted with latch;Skin to skin;Breast massage;Hand express;Breast compression;Adjust position;Support pillows;Education;LC  Services brochure;LPT handout/interventions;CDC Guidelines for Breast Pump Cleaning  Discharge Pump: Personal;Hands Free  Consult Status Consult Status: Follow-up Date: 10/27/23 Follow-up type: In-patient    Lisa Best 10/27/2023, 9:06 AM

## 2023-10-27 NOTE — Anesthesia Postprocedure Evaluation (Signed)
 Anesthesia Post Note  Patient: Lisa Best  Procedure(s) Performed: AN AD HOC LABOR EPIDURAL     Patient location during evaluation: Mother Baby Anesthesia Type: Epidural Level of consciousness: awake and alert Pain management: pain level controlled Vital Signs Assessment: post-procedure vital signs reviewed and stable Respiratory status: spontaneous breathing, nonlabored ventilation and respiratory function stable Cardiovascular status: stable Postop Assessment: no headache, no backache and epidural receding Anesthetic complications: no   No notable events documented.  Last Vitals:  Vitals:   10/27/23 0715 10/27/23 0811  BP: 122/76 112/75  Pulse: 84 78  Resp: 17 18  Temp: 36.9 C 36.8 C  SpO2: 100% 99%    Last Pain:  Vitals:   10/27/23 0822  TempSrc:   PainSc: 0-No pain   Pain Goal:                   Naeem Quillin

## 2023-10-27 NOTE — Progress Notes (Signed)
 Post Partum Day 0 Subjective: no complaints, up ad lib, voiding, tolerating PO, and + flatus  Objective: Blood pressure 112/75, pulse 78, temperature 98.2 F (36.8 C), resp. rate 18, height 5' 5 (1.651 m), weight 64.4 kg, SpO2 99%, unknown if currently breastfeeding.  Physical Exam:  General: alert, cooperative, and no distress Lochia: appropriate Uterine Fundus: firm DVT Evaluation: No evidence of DVT seen on physical exam.  Recent Labs    10/26/23 2053  HGB 9.4*  HCT 28.0*    Assessment/Plan: 23 yo G2 now P2002 PPD0 s/p SVD @ 37.4 after presenting with PROM  Plan for discharge tomorrow and Breastfeeding -acute blood loss anemia on anemia of pregnancy: Clinically significant for this hospitalization. Hgb 10.4-9.4. Recommend PO iron at home.  -s/p retained placenta: required Banjo curettage and uterine sweep. S/p one dose each of Ancef  and Flagyl .  LOS: 1 day   Rubie DELENA Husky, MD 10/27/2023, 8:21 AM

## 2023-10-27 NOTE — Progress Notes (Signed)
 Lisa Best is a 23 y.o. G2P0001 at [redacted]w[redacted]d   Recently arrived to L&D from [facility]U  Objective: BP 120/69   Pulse 87   Temp 98.7 F (37.1 C) (Oral)   Resp 18   Ht 5' 5 (1.651 m)   Wt 64.4 kg   SpO2 99%   BMI 23.63 kg/m  No intake/output data recorded. No intake/output data recorded.  FHT:  FHR: 140 bpm, variability: moderate,  accelerations:  Present,  decelerations:  Present variable x2 UC:   q3-4 SVE:   Dilation: 2 Effacement (%): 80 Station: -3 Exam by:: Dr. Claudene   Assessment / Plan: 23 yo G2P1001 @ 37.4 with PROM  Labor:  Latent labor, progressing spontaneously with regular contractions. Pitocin  PRN Preeclampsia:   N/A Fetal Wellbeing:  Category I Pain Control:  Nitrous Oxide I/D:  n/a Anticipated MOD:  NSVD  Lisa DELENA Claudene, DO 10/27/2023, 12:40 AM

## 2023-10-28 ENCOUNTER — Inpatient Hospital Stay (HOSPITAL_COMMUNITY): Payer: Medicaid Other

## 2023-10-28 ENCOUNTER — Encounter (HOSPITAL_COMMUNITY): Payer: Self-pay

## 2023-10-28 LAB — SURGICAL PATHOLOGY

## 2023-10-28 MED ORDER — FERROUS SULFATE 325 (65 FE) MG PO TABS
325.0000 mg | ORAL_TABLET | Freq: Every day | ORAL | Status: DC
  Administered 2023-10-28 – 2023-10-29 (×2): 325 mg via ORAL
  Filled 2023-10-28 (×2): qty 1

## 2023-10-28 NOTE — Progress Notes (Addendum)
 Post Partum Day 1 Subjective: no complaints, up ad lib, voiding, tolerating PO, and + flatus Normal lochia. Not lightheaded or dizzy when she ambulated.   Objective: Blood pressure 105/69, pulse 85, temperature 98 F (36.7 C), resp. rate 18, height 5' 5 (1.651 m), weight 64.4 kg, SpO2 99%, unknown if currently breastfeeding.  Physical Exam:  General: alert, cooperative, and no distress Lochia: appropriate Uterine Fundus: firm DVT Evaluation: No evidence of DVT seen on physical exam.  Recent Labs    10/26/23 2053 10/27/23 0808  HGB 9.4* 7.8*  HCT 28.0* 23.1*    Assessment/Plan: 23 yo G2 now P2002 PPD1 s/p SVD @ 37.4 after presenting with PROM. Delivery complicated by retained placenta -acute blood loss anemia on anemia of pregnancy: Clinically significant for this hospitalization. Hgb 7.8, asymptomatic. Discussed IV vs. PO iron, agrees to start PO orin -s/p retained placenta: required Banjo curettage and uterine sweep. S/p one dose each of Ancef  and Flagyl . - Rh pos - dispo: anticipate discharge tomorrow  LOS: 2 days   Rosaline FORBES Chapel, MD 10/28/2023, 8:31 AM

## 2023-10-29 MED ORDER — IBUPROFEN 600 MG PO TABS: 600.0000 mg | ORAL_TABLET | Freq: Four times a day (QID) | ORAL | 0 refills | Status: AC

## 2023-10-29 MED ORDER — FERROUS SULFATE 325 (65 FE) MG PO TABS: 325.0000 mg | ORAL_TABLET | Freq: Every day | ORAL | 3 refills | Status: AC

## 2023-10-29 NOTE — Progress Notes (Signed)
 Post Partum Day 2 Subjective: Doing well. Bleeding light. No F/C. Pain controlled. Tolerating PO. +BM. Baby latching but minimal colostrum-pumping after breastfeeding. No dizziness when ambulating  Objective: Blood pressure 107/77, pulse 78, temperature 98.2 F (36.8 C), temperature source Oral, resp. rate 18, height 5' 5 (1.651 m), weight 64.4 kg, SpO2 100%, unknown if currently breastfeeding.  Physical Exam:  General: alert, cooperative, and no distress Lochia: appropriate Uterine Fundus: firm, non-tender Incision: N/A DVT Evaluation: No evidence of DVT seen on physical exam. No significant calf/ankle edema.  Recent Labs    10/26/23 2053 10/27/23 0808  HGB 9.4* 7.8*  HCT 28.0* 23.1*    Assessment/Plan: 23 yo G2 now P2002 PPD2 s/p SVD @ 37.4 after presenting with PROM. Delivery complicated by retained placenta -acute blood loss anemia on anemia of pregnancy: Clinically significant for this hospitalization. Hgb 7.8, asymptomatic. On PO iron -s/p retained placenta: required Banjo curettage and uterine sweep. S/p one dose each of Ancef  and Flagyl . Afebrile, no uterine tenderness - Rh pos - DC home   LOS: 3 days   Lisa DELENA Sharps, DO 10/29/2023, 11:57 AM

## 2023-10-29 NOTE — Discharge Instructions (Signed)
 Call office with any concerns [phone]

## 2023-10-29 NOTE — Lactation Note (Signed)
 This note was copied from a baby's chart. Lactation Consultation Note  Patient Name: Lisa Best Unijb'd Date: 10/29/2023 Age:25 hours Reason for consult: Follow-up assessment;Early term 37-38.6wks.  Per MOB, infant is BF well, infant recently BF for 15 minutes and afterwards was supplemented with 25 mls of 22 kcal formula. MOB is follow LPTI feeding but offering more formula after infant is latched at the breast. MOB will continue to BF infant 8+ times per day. MOB knows to call for latch assistance if needed. MOB is experienced with breastfeeding, she BF her first child for over 2 years. Per MOB, she used DEBP 3 times today and plans to continue to pump every 3 hours for 15 minutes.   Maternal Data    Feeding Mother's Current Feeding Choice: Breast Milk and Formula  LATCH Score  LC did not observe latch due infant finished BF prior to [facility] entering the room.                   Lactation Tools Discussed/Used    Interventions Interventions: Skin to skin;Position options;Education;Pace feeding;LPT handout/interventions  Discharge    Consult Status Consult Status: Follow-up Date: 10/29/23 Follow-up type: In-patient    Grayce LULLA Batter 10/29/2023, 2:28 AM

## 2023-10-29 NOTE — Discharge Summary (Signed)
 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 yo G2P1002 at [redacted]w[redacted]d was admitted in Latent Labor after prelabor rupture of membranes on 10/26/2023. She progressed spontaneously and her labor course was uncomplicated. Membrane Rupture Time/Date: 7:00 PM,10/26/2023  Delivery Method:Vaginal, Spontaneous Operative Delivery:N/A Episiotomy: Left Mediolateral Lacerations:  2nd degree Patient had a postpartum course complicated by retained placenta requiring manual removal at bedside and acute on chronic anemia.  She is ambulating, tolerating a regular diet, passing flatus, and urinating well. Patient is discharged home in stable condition on 10/29/23.  Newborn Data: Birth date:10/27/2023 Birth time:4:04 AM Gender:Female Living status:Living Apgars:8 ,9  Weight:2560 g  Magnesium  Sulfate received: No BMZ received: No Rhophylac:N/A MMR: N/A T-DaP:Given prenatally Flu: given prenatally RSV Vaccine received: given prenatally Transfusion:No Immunizations administered: Immunization History  Administered Date(s) Administered   Influenza,inj,Quad PF,6+ Mos 11/28/2017    Physical exam  Vitals:   10/28/23 0509 10/28/23 1358 10/28/23 2043 10/29/23  0532  BP: 105/69 117/76 113/79 107/77  Pulse: 85 89 81 78  Resp: 18 18 18    Temp:  97.9 F (36.6 C) 98.1 F (36.7 C) 98.2 F (36.8 C)  TempSrc:  Oral Oral Oral  SpO2: 99%   100%  Weight:      Height:       General: alert and no distress Lochia: appropriate Uterine Fundus: firm Incision: N/A DVT Evaluation: No evidence of DVT seen on physical exam. No significant calf/ankle edema. Labs: Lab Results  Component Value Date   WBC 10.2 10/27/2023   HGB 7.8 (L) 10/27/2023   HCT 23.1 (L) 10/27/2023   MCV 66.8 (L) 10/27/2023   PLT 212 10/27/2023      Latest Ref Rng & Units 09/13/2023    7:35 PM  CMP  Glucose 70 - 99 mg/dL 84   BUN 6 - 20 mg/dL 8   Creatinine 9.55 - 8.99 mg/dL 9.35   Sodium 864 - 854 mmol/L 131   Potassium 3.5 - 5.1 mmol/L 4.0   Chloride 98 - 111 mmol/L 104   CO2 22 - 32 mmol/L 20   Calcium 8.9 - 10.3 mg/dL 8.4    Edinburgh Score:    10/29/2023   12:03 AM  Edinburgh Postnatal Depression Scale Screening Tool  I have been able to laugh and see the funny side of things. 0  I have looked forward with enjoyment to things. 0  I have blamed myself unnecessarily when things went wrong. 1  I have been anxious or worried for no good reason. 1  I have felt scared or panicky for no good reason. 0  Things have  been getting on top of me. 0  I have been so unhappy that I have had difficulty sleeping. 0  I have felt sad or miserable. 0  I have been so unhappy that I have been crying. 0  The thought of harming myself has occurred to me. 0  Edinburgh Postnatal Depression Scale Total 2      After visit meds:  Allergies as of 10/29/2023       Reactions   Bee Venom Swelling        Medication List     STOP taking these medications    cephALEXin  500 MG capsule Commonly known as: KEFLEX    EPIPEN  2-PAK IJ   metroNIDAZOLE  500 MG tablet Commonly known as: FLAGYL    ondansetron  4 MG disintegrating tablet Commonly known as: Zofran  ODT   phenazopyridine  200  MG tablet Commonly known as: PYRIDIUM        TAKE these medications    ferrous sulfate  325 (65 FE) MG tablet Take 1 tablet (325 mg total) by mouth daily with breakfast. Start taking on: October 30, 2023   ibuprofen  600 MG tablet Commonly known as: ADVIL  Take 1 tablet (600 mg total) by mouth every 6 (six) hours.   prenatal multivitamin Tabs tablet Take 1 tablet by mouth daily at 12 noon.         Discharge home in stable condition Infant Feeding: Bottle and Breast Infant Disposition:home with mother Discharge instruction: per After Visit Summary and Postpartum booklet. Activity: Advance as tolerated. Pelvic rest for 6 weeks.  Diet: routine diet Anticipated Birth Control: Depo Postpartum Appointment:6 weeks Additional Postpartum F/U: Postpartum Depression checkup Future Appointments:No future appointments. Follow up Visit:  Follow-up Information     Ob/Gyn, Landy Stains. Go in 6 week(s).   Contact information: [address] [place] [address] [phone]                     10/29/2023 [name] Sharps, DO

## 2023-10-29 NOTE — Lactation Note (Signed)
 This note was copied from a baby's chart. Lactation Consultation Note  Patient Name: Lisa Best Date: 10/29/2023 Age:25 hours Reason for consult: Follow-up assessment;Early term 37-38.6wks;Maternal discharge  P2 mom of 11 hour old infant consulted for discharge education. Mom reports infant is feeding well but she is also supplementing while milk transitions. Mom breastfed first child for two years and voiced concerns with output of breast milk during this journey - minimal colostrum expressed although frequency of pumping has been optimal. LC asked if mom was watching the pump while in session and she replied, yes, staring at it the entire time. LC discussed a release of increased cortisol when anxious or stressed so recommended to limit watching the pump while in session. Instead, use a pumping bra and use hands to do other things while pumping. Not watching can allow for increased output especially with it being second pregnancy. Reviewed engorgement treatment measures and how to reach lactation once home. Praised mom for her efforts with infant feeding and reminded her to relax while pumping to elicit flow.  Maternal Data    Feeding Mother's Current Feeding Choice: Breast Milk and Formula   Lactation Tools Discussed/Used    Interventions Interventions: Breast feeding basics reviewed;Education  Discharge Discharge Education: Engorgement and breast care;Warning signs for feeding baby;Outpatient recommendation  Consult Status Consult Status: Complete Date: 10/29/23    Lisa Best 10/29/2023, 8:24 AM

## 2023-11-04 ENCOUNTER — Telehealth (HOSPITAL_COMMUNITY): Payer: Self-pay | Admitting: *Deleted

## 2023-11-04 NOTE — Telephone Encounter (Signed)
Attempted hospital discharge follow-up call. Left message for patient to return RN call with any questions or concerns. Deforest Hoyles, RN, 11/04/23, [phone]
